# Patient Record
Sex: Female | Born: 1995 | Race: White | Hispanic: No | Marital: Single | Smoking: Current every day smoker
Health system: Southern US, Community
[De-identification: ages and names within clinical notes are randomized; demographics above are authoritative.]

## PROBLEM LIST (undated history)

## (undated) DIAGNOSIS — Z975 Presence of (intrauterine) contraceptive device: Secondary | ICD-10-CM

## (undated) DIAGNOSIS — B1920 Unspecified viral hepatitis C without hepatic coma: Secondary | ICD-10-CM

## (undated) DIAGNOSIS — F909 Attention-deficit hyperactivity disorder, unspecified type: Secondary | ICD-10-CM

## (undated) HISTORY — DX: Unspecified viral hepatitis C without hepatic coma: B19.20

## (undated) HISTORY — DX: Presence of (intrauterine) contraceptive device: Z97.5

## (undated) HISTORY — PX: NO PAST SURGERIES: SHX2092

---
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
## 2017-01-06 ENCOUNTER — Encounter: Payer: Medicaid Other | Admitting: Adult Health

## 2017-01-06 ENCOUNTER — Encounter: Payer: Self-pay | Admitting: *Deleted

## 2017-03-15 ENCOUNTER — Encounter: Payer: Self-pay | Admitting: *Deleted

## 2017-03-15 ENCOUNTER — Encounter: Payer: Medicaid Other | Admitting: Women's Health

## 2017-04-29 ENCOUNTER — Telehealth: Payer: Self-pay | Admitting: Obstetrics & Gynecology

## 2017-04-29 NOTE — Telephone Encounter (Signed)
Pt called stating that her nexplanon was due to come out in March of this year. She is asking if leaving it in can cause infertility or other adverse affects. I explained that it does not cause infertility but if its past 3 years it may not be effective in preventing pregnancy and that she should get it removed and reinserted soon. Pt states she has made an appt for that.

## 2017-08-06 ENCOUNTER — Encounter (HOSPITAL_COMMUNITY): Payer: Self-pay | Admitting: Emergency Medicine

## 2017-08-06 ENCOUNTER — Other Ambulatory Visit: Payer: Self-pay

## 2017-08-06 ENCOUNTER — Emergency Department (HOSPITAL_COMMUNITY)
Admission: EM | Admit: 2017-08-06 | Discharge: 2017-08-07 | Disposition: A | Discharge status: Home / Self Care | Payer: Self-pay | Attending: Emergency Medicine | Admitting: Emergency Medicine

## 2017-08-06 DIAGNOSIS — Z046 Encounter for general psychiatric examination, requested by authority: Secondary | ICD-10-CM | POA: Insufficient documentation

## 2017-08-06 DIAGNOSIS — F17210 Nicotine dependence, cigarettes, uncomplicated: Secondary | ICD-10-CM | POA: Insufficient documentation

## 2017-08-06 DIAGNOSIS — F1910 Other psychoactive substance abuse, uncomplicated: Secondary | ICD-10-CM | POA: Insufficient documentation

## 2017-08-06 DIAGNOSIS — N898 Other specified noninflammatory disorders of vagina: Secondary | ICD-10-CM | POA: Insufficient documentation

## 2017-08-06 DIAGNOSIS — Z79899 Other long term (current) drug therapy: Secondary | ICD-10-CM | POA: Insufficient documentation

## 2017-08-06 LAB — PREGNANCY, URINE: Preg Test, Ur: NEGATIVE

## 2017-08-06 LAB — CBC
HCT: 42.8 % (ref 36.0–46.0)
HEMOGLOBIN: 13.8 g/dL (ref 12.0–15.0)
MCH: 30.1 pg (ref 26.0–34.0)
MCHC: 32.2 g/dL (ref 30.0–36.0)
MCV: 93.2 fL (ref 78.0–100.0)
PLATELETS: 339 10*3/uL (ref 150–400)
RBC: 4.59 MIL/uL (ref 3.87–5.11)
RDW: 13.3 % (ref 11.5–15.5)
WBC: 7.4 10*3/uL (ref 4.0–10.5)

## 2017-08-06 LAB — COMPREHENSIVE METABOLIC PANEL
ALK PHOS: 76 U/L (ref 38–126)
ALT: 30 U/L (ref 14–54)
ANION GAP: 9 (ref 5–15)
AST: 22 U/L (ref 15–41)
Albumin: 4.8 g/dL (ref 3.5–5.0)
BILIRUBIN TOTAL: 0.6 mg/dL (ref 0.3–1.2)
BUN: 8 mg/dL (ref 6–20)
CALCIUM: 10.1 mg/dL (ref 8.9–10.3)
CO2: 27 mmol/L (ref 22–32)
Chloride: 103 mmol/L (ref 101–111)
Creatinine, Ser: 0.95 mg/dL (ref 0.44–1.00)
GLUCOSE: 85 mg/dL (ref 65–99)
POTASSIUM: 4 mmol/L (ref 3.5–5.1)
Sodium: 139 mmol/L (ref 135–145)
TOTAL PROTEIN: 8.6 g/dL — AB (ref 6.5–8.1)

## 2017-08-06 LAB — RAPID URINE DRUG SCREEN, HOSP PERFORMED
AMPHETAMINES: NOT DETECTED
Barbiturates: NOT DETECTED
Benzodiazepines: NOT DETECTED
COCAINE: POSITIVE — AB
OPIATES: NOT DETECTED
Tetrahydrocannabinol: POSITIVE — AB

## 2017-08-06 LAB — ETHANOL

## 2017-08-06 NOTE — ED Notes (Signed)
Pt wanded in triage  

## 2017-08-06 NOTE — ED Notes (Signed)
Patient given Sprite Zero at this time. 

## 2017-08-06 NOTE — ED Notes (Signed)
Patient went to restroom, patient in room given graham crackers and peanutbutter

## 2017-08-06 NOTE — ED Triage Notes (Signed)
Pt here under IVC from grandmother. Grandmother states pt has been shooting up cocaine which pt agrees to. Grandmother states pt stated she wanted to die and she wanted to kill her self. Pt denies this. Pt last used this morning. Pt denies si//hi/avh. Pt handcuffed bilateral wrist.

## 2017-08-06 NOTE — ED Notes (Signed)
Pt's grandmother called stating she was the one who took IVC papers out on pt and needed to be contacted regarding collateral information.  Her name is Darl PikesSusan and she can be reached at [phone].

## 2017-08-06 NOTE — BH Assessment (Addendum)
Tele Assessment Note   Patient Name: Amber Maynard MRN: [id number] Referring Physician: [facility] DO Location of [facility]: [facility] Location of Provider: Behavioral Health TTS Department  Amber Maynard is an 21 y.o. female who was IVC'd today by her Adaline SillGM Susan Costine [phone]). Per IVC, GM sts pt threatened to kill herself and stated she wanted to die. Pt denies SI, HI, SHI and AVH. Pt has no previous hx of psychiatric treatment aside from being prescribedf ADHD medication by her PCP. Pt has no psychiatrist or OP therapist and never has had. Pt has never been psychiatrically hospitalized. IVC sts that pt uses cocaine regularly and pt admits regular cocaine and infrequent cannabis use. Pt sts she uses cocaine 5 of 7 days in a week. Pt sts she wants to stop using and told ED staff she recently went to a rehab facility to ask about admission. Pt sts she left when the facility looked too much "like a mental health place." Pt denies making any statements threatening to kill herself or stating she wants to die. Pt sts that her GM IVC'd her because "she wants to speed the process up" meaning the process of pt going to rehab. Pt's GM could not be reached at the time of the assessment. A HIPAA compliant message was left for a callback.   Pt sts she lives with her GM. Pt sts she has obtained her GED and works at [employer] as a Conservation officer, naturecashier. Pt denies all symptoms of depression and anxiety. Pt sts she has never been married and has no children. Pt denies any anger outbursts resulting in harm to others or property damage. Pt denies any access to guns or weapons. Pt denies any hx of psychiatric treatment including IP or OP treatment. Pt sts she sleeps about 5-6 hours each night and eats regularly and well with no significant weight changes in recent months. Pt denies any hx of abuse. Pt sts she uses cocaine 5 of 7 days each week, smokes 1/2 pack of cigarettes daily and uses cannabis once every 2-3 months. Pt tested  positive for cannabis and cocaine tonight in the [facility] but negative for all others.   Pt was dressed in appropriate, modest street clothes and sitting on her hospital bed. Pt appeared disheveled. Pt appeared pleasant and euthymic. Pt was alert, cooperative and polite. Pt kept good eye contact, spoke in a clear tone and at a normal pace. Pt moved in a normal manner when moving. Pt's thought process was coherent and relevant and judgement was partially impaired related to drug use.  No indication of delusional thinking or response to internal stimuli. Pt's mood was stated as neither depressed nor anxious and her euthymic affect was congruent.  Pt was oriented x 4, to person, place, time and situation.   Diagnosis: Cocaine Use D/O, Severe; Cannabis Use D/O, Mild  Past Medical History:  Past Medical History:  Diagnosis Date  . ADHD (attention deficit hyperactivity disorder)   . Nexplanon in place 03/14/2014    History reviewed. No pertinent surgical history.  Family History: History reviewed. No pertinent family history.  Social History:  reports that she has been smoking cigarettes.  She has been smoking about 0.50 packs per day. she has never used smokeless tobacco. She reports that she uses drugs. Drug: Cocaine. She reports that she does not drink alcohol.  Additional Social History:  Alcohol / Drug Use Prescriptions: SEE MAR History of alcohol / drug use?: Yes Longest period of sobriety (when/how long): UNKNOWN  Substance #1 Name of Substance 1: COCAINE 1 - Age of First Use: 19 1 - Amount (size/oz): VARIES 1 - Frequency: 5 OF 7 DAYS PER WEEK 1 - Duration: ONGOING 1 - Last Use / Amount: 08/06/17 Substance #2 Name of Substance 2: NICOTINE/CIGARETTES 2 - Age of First Use: 17 2 - Amount (size/oz): 1/2 PACK 2 - Frequency: DAILY 2 - Duration: ONGOING 2 - Last Use / Amount: 08/06/17 Substance #3 Name of Substance 3: CANNABIS 3 - Age of First Use: 17 3 - Amount (size/oz): VARIES 3 -  Frequency: ONCE EVERY 2-3 MONTHS 3 - Duration: ONGOING 3 - Last Use / Amount: LAST WEEK  CIWA: CIWA-Ar BP: 107/76 Pulse Rate: 78 COWS:    PATIENT STRENGTHS: (choose at least two) Average or above average intelligence Communication skills Physical Health Supportive family/friends  Allergies: No Known Allergies  Home Medications:  (Not in a hospital admission)  OB/GYN Status:  Patient's last menstrual period was 08/03/2017.  General Assessment Data Location of Assessment: [facility] TTS Assessment: In system Is this a Tele or Face-to-Face Assessment?: Tele Assessment Is this an Initial Assessment or a Re-assessment for this encounter?: Initial Assessment Marital status: Single Maiden name: HAKEEM Is patient pregnant?: No Pregnancy Status: No Living Arrangements: Other relatives(LIVES W GM) Can pt return to current living arrangement?: Yes Admission Status: Involuntary Is patient capable of signing voluntary admission?: Yes Referral Source: Self/Family/Friend Insurance type: SELF PAY     Crisis Care Plan Living Arrangements: Other relatives(LIVES W GM) Name of Psychiatrist: NONE-PCP PRESCRIBES ADHD MEDS Name of Therapist: NONE  Education Status Is patient currently in school?: No Highest grade of school patient has completed: GED  Risk to self with the past 6 months Suicidal Ideation: No(DENIES) Has patient been a risk to self within the past 6 months prior to admission? : No Suicidal Intent: No(DENIES) Has patient had any suicidal intent within the past 6 months prior to admission? : No Is patient at risk for suicide?: No Suicidal Plan?: No Has patient had any suicidal plan within the past 6 months prior to admission? : No Access to Means: No(DENIES ACCESS TO GUNS) What has been your use of drugs/alcohol within the last 12 months?: REGULAR USE Previous Attempts/Gestures: No(DENIES) How many times?: 0 Other Self Harm Risks: NONE REPORTED Triggers for Past  Attempts: None known Intentional Self Injurious Behavior: None Family Suicide History: Unknown Recent stressful life event(s): (NONE REPORTED) Persecutory voices/beliefs?: No Depression: No Depression Symptoms: (DENIES ALL SYMPTOMS) Substance abuse history and/or treatment for substance abuse?: Yes(USE-YES; TX-NO) Suicide prevention information given to non-admitted patients: Not applicable  Risk to Others within the past 6 months Homicidal Ideation: No(DENIES) Does patient have any lifetime risk of violence toward others beyond the six months prior to admission? : No(DENIES) Thoughts of Harm to Others: No(DENIES) Current Homicidal Intent: No Current Homicidal Plan: No Access to Homicidal Means: No Identified Victim: NONE History of harm to others?: No(DENIES) Assessment of Violence: None Noted Violent Behavior Description: NA Does patient have access to weapons?: No Criminal Charges Pending?: No(DENIES) Does patient have a court date: No Is patient on probation?: No  Psychosis Hallucinations: None noted(DENIES) Delusions: None noted  Mental Status Report Appearance/Hygiene: Disheveled, Unremarkable Eye Contact: Good Motor Activity: Freedom of movement Speech: Logical/coherent Level of Consciousness: Alert Mood: Pleasant, Euthymic Affect: Appropriate to circumstance Anxiety Level: Minimal Thought Processes: Coherent, Relevant Judgement: Partial Orientation: Person, Place, Time, Situation Obsessive Compulsive Thoughts/Behaviors: None  Cognitive Functioning Concentration: Normal Memory: Recent Intact, Remote  Intact IQ: Average Insight: Good Impulse Control: Fair Appetite: Good Weight Loss: 0 Weight Gain: 0 Sleep: No Change Total Hours of Sleep: (5-6) Vegetative Symptoms: None  ADLScreening [facility] Assessment Services) Patient's cognitive ability adequate to safely complete daily activities?: Yes Patient able to express need for assistance with ADLs?:  Yes Independently performs ADLs?: Yes (appropriate for developmental age)  Prior Inpatient Therapy Prior Inpatient Therapy: No  Prior Outpatient Therapy Prior Outpatient Therapy: No Does patient have an ACCT team?: No Does patient have Intensive In-House Services?  : No Does patient have [facility] services? : No Does patient have P4CC services?: No  ADL Screening (condition at time of admission) Patient's cognitive ability adequate to safely complete daily activities?: Yes Patient able to express need for assistance with ADLs?: Yes Independently performs ADLs?: Yes (appropriate for developmental age)       Abuse/Neglect Assessment (Assessment to be complete while patient is alone) Physical Abuse: Denies Verbal Abuse: Denies Sexual Abuse: Denies     Merchant navy officerAdvance Directives (For Healthcare) Does Patient Have a Medical Advance Directive?: No Would patient like information on creating a medical advance directive?: No - Patient declined    Additional Information 1:1 In Past 12 Months?: No CIRT Risk: No Elopement Risk: No Does patient have medical clearance?: Yes     Disposition:  Disposition Initial Assessment Completed for this Encounter: Yes Disposition of Patient: Other dispositions Other disposition(s): Other (Comment)([facility]ING [facility])  This service was provided via telemedicine using a 2-way, interactive audio and Immunologistvideo technology.  Names of all persons participating in this telemedicine service and their role in this encounter. Name: Teola BradleyBrooklyn Maynard Role: Patient  Name: Beryle FlockMary Malania Gawthrop Role: [facility] Specialist, [facility], CRC  Name:  Role:   Name:  Role:    Seeking collateral information from GM before final recommendation.    Beryle FlockMary Kristi Norment, MS, CRC, [facility] Triage Specialist [facility] Amber Maynard T 08/06/2017 8:53 PM

## 2017-08-06 NOTE — ED Provider Notes (Signed)
[facility] EMERGENCY DEPARTMENT Provider Note   CSN: [id number] Arrival date & time: 08/06/17  1248     History   Chief Complaint Chief Complaint  Patient presents with  . Medical Clearance    HPI Amber Maynard is a 21 y.o. female.  HPI  Pt was seen at 1815. Per Police and pt:  Pt brought to the ED with Police under IVC taken out by her grandmother. Pt states she "uses drugs" and her grandmother became aware. Pt states she wanted to go to rehab, but when she got to the facility she "left because it was like a mental health place." IVC states pt "wanted to die" and "wanted to kill herself." Pt denies this. LD cocaine this morning. Denies SI, no SA, no HI, no hallucinations.    Past Medical History:  Diagnosis Date  . ADHD (attention deficit hyperactivity disorder)   . Nexplanon in place 03/14/2014    Patient Active Problem List   Diagnosis Date Noted  . Right ovarian cyst 06/03/2016  . Nexplanon in place 03/14/2014    History reviewed. No pertinent surgical history.  OB History    Gravida Para Term Preterm AB Living   0 0 0 0 0 0   SAB TAB Ectopic Multiple Live Births   0 0 0 0 0       Home Medications    Prior to Admission medications   Medication Sig Start Date End Date Taking? Authorizing Provider  acetaminophen (TYLENOL) 500 MG tablet Take 500 mg by mouth every 6 (six) hours as needed.   Yes [provider]  amphetamine-dextroamphetamine (ADDERALL) 20 MG tablet Take 20 mg by mouth daily.   Yes [provider]  etonogestrel (NEXPLANON) 68 MG IMPL implant Inject 1 each into the skin once.   Yes [provider]  naproxen (NAPROSYN) 500 MG tablet Take 1 tablet (500 mg total) by mouth 2 (two) times daily with a meal. 06/03/16  Yes Cresenzo-Dishmon, Scarlette Calico, CNM    Family History History reviewed. No pertinent family history.  Social History Social History   Tobacco Use  . Smoking status: Current Every Day Smoker   Packs/day: 0.50    Types: Cigarettes  . Smokeless tobacco: Never Used  Substance Use Topics  . Alcohol use: No  . Drug use: Yes    Types: Cocaine     Allergies   Patient has no known allergies.   Review of Systems Review of Systems ROS: Statement: All systems negative except as marked or noted in the HPI; Constitutional: Negative for fever and chills. ; ; Eyes: Negative for eye pain, redness and discharge. ; ; ENMT: Negative for ear pain, hoarseness, nasal congestion, sinus pressure and sore throat. ; ; Cardiovascular: Negative for chest pain, palpitations, diaphoresis, dyspnea and peripheral edema. ; ; Respiratory: Negative for cough, wheezing and stridor. ; ; Gastrointestinal: Negative for nausea, vomiting, diarrhea, abdominal pain, blood in stool, hematemesis, jaundice and rectal bleeding. . ; ; Genitourinary: Negative for dysuria, flank pain and hematuria. ; ; Musculoskeletal: Negative for back pain and neck pain. Negative for swelling and trauma.; ; Skin: Negative for pruritus, rash, abrasions, blisters, bruising and skin lesion.; ; Neuro: Negative for headache, lightheadedness and neck stiffness. Negative for weakness, altered level of consciousness, altered mental status, extremity weakness, paresthesias, involuntary movement, seizure and syncope.;; Psych:  Denies SI while in the ED. No SA, no HI, no hallucinations.     Physical Exam Updated Vital Signs BP 107/76 (BP Location: Left  Arm)   Pulse 78   Temp 98.3 F (36.8 C) (Oral)   Resp 17   LMP 08/03/2017   SpO2 100%   Physical Exam 1820: Physical examination:  Nursing notes reviewed; Vital signs and O2 SAT reviewed;  Constitutional: Well developed, Well nourished, Well hydrated, In no acute distress; Head:  Normocephalic, atraumatic; Eyes: EOMI, PERRL, No scleral icterus; ENMT: Mouth and pharynx normal, Mucous membranes moist; Neck: Supple, Full range of motion; Cardiovascular: Regular rate and rhythm; Respiratory: Breath sounds  clear, No wheezes.  Speaking full sentences with ease, Normal respiratory effort/excursion; Chest: No deformity, Movement normal; Abdomen: Nondistended; Extremities: No deformity.; Neuro: AA&Ox3, Major CN grossly intact.  Speech clear. No gross focal motor deficits in extremities. Climbs on and off stretcher easily by herself. Gait steady.; Skin: Color normal, Warm, Dry.; Psych:  Affect full. Denies SI/SA.     ED Treatments / Results  Labs (all labs ordered are listed, but only abnormal results are displayed)   EKG  EKG Interpretation None       Radiology   Procedures Procedures (including critical care time)  Medications Ordered in ED Medications - No data to display   Initial Impression / Assessment and Plan / ED Course  I have reviewed the triage vital signs and the nursing notes.  Pertinent labs & imaging results that were available during my care of the patient were reviewed by me and considered in my medical decision making (see chart for details).  MDM Reviewed: previous chart, nursing note and vitals Reviewed previous: labs Interpretation: labs   Results for orders placed or performed during the hospital encounter of 08/06/17  Comprehensive metabolic panel  Result Value Ref Range   Sodium 139 135 - 145 mmol/L   Potassium 4.0 3.5 - 5.1 mmol/L   Chloride 103 101 - 111 mmol/L   CO2 27 22 - 32 mmol/L   Glucose, Bld 85 65 - 99 mg/dL   BUN 8 6 - 20 mg/dL   Creatinine, Ser 1.610.95 0.44 - 1.00 mg/dL   Calcium 09.610.1 8.9 - 04.510.3 mg/dL   Total Protein 8.6 (H) 6.5 - 8.1 g/dL   Albumin 4.8 3.5 - 5.0 g/dL   AST 22 15 - 41 U/L   ALT 30 14 - 54 U/L   Alkaline Phosphatase 76 38 - 126 U/L   Total Bilirubin 0.6 0.3 - 1.2 mg/dL   GFR calc non Af Amer >60 >60 mL/min   GFR calc Af Amer >60 >60 mL/min   Anion gap 9 5 - 15  Ethanol  Result Value Ref Range   Alcohol, Ethyl (B) <10 <10 mg/dL  cbc  Result Value Ref Range   WBC 7.4 4.0 - 10.5 K/uL   RBC 4.59 3.87 - 5.11 MIL/uL    Hemoglobin 13.8 12.0 - 15.0 g/dL   HCT 40.942.8 81.136.0 - 91.446.0 %   MCV 93.2 78.0 - 100.0 fL   MCH 30.1 26.0 - 34.0 pg   MCHC 32.2 30.0 - 36.0 g/dL   RDW 78.213.3 95.611.5 - 21.315.5 %   Platelets 339 150 - 400 K/uL  Rapid urine drug screen (hospital performed)  Result Value Ref Range   Opiates NONE DETECTED NONE DETECTED   Cocaine POSITIVE (A) NONE DETECTED   Benzodiazepines NONE DETECTED NONE DETECTED   Amphetamines NONE DETECTED NONE DETECTED   Tetrahydrocannabinol POSITIVE (A) NONE DETECTED   Barbiturates NONE DETECTED NONE DETECTED  Pregnancy, urine  Result Value Ref Range   Preg Test, Ur NEGATIVE NEGATIVE  1840:  Will have TTS evaluate. Holding orders written.    Final Clinical Impressions(s) / ED Diagnoses   Final diagnoses:  None    ED Discharge Orders    None       Samuel JesterMcManus, Antavia Tandy, DO 08/06/17 2133

## 2017-08-06 NOTE — B[facility] Counselor (Signed)
Called GM Monia PouchSusan Costine at [phone] with pt's permission. GM was petitioner on IVC today. No answer. Left a HIPAA compliant message for a callback.

## 2017-08-07 LAB — WET PREP, GENITAL
Clue Cells Wet Prep HPF POC: NONE SEEN
SPERM: NONE SEEN
TRICH WET PREP: NONE SEEN
Yeast Wet Prep HPF POC: NONE SEEN

## 2017-08-07 NOTE — ED Notes (Signed)
Unable to reach next of kin by phone.

## 2017-08-07 NOTE — Discharge Instructions (Signed)
Substance Abuse Treatment Programs ° °Intensive Outpatient Programs °[facility]     °[address]      °[place], [place]                   °[phone]      ° °[facility] °[address] °[place], [place] °336-379-7146 ° °[facility]     °(Inpatient and outpatient)     °[address]           °336-832-9800   ° °[facility] °336-288-1484 (Suboxone and Methadone) ° °[address]      °[place], [place] [address]      °336-882-2125      ° °[address] °[place], [place] °852-3033 ° °[facility] (Outpatient/Inpatient, Chemical)    °(insurance only) 336-621-3381      °       °[facility] (Groups & Residential) °[place], [place] °336-389-1413 ° °   °[facility]     °[address]     °[place], [place]      °336-389-1413      ° °[facility] (for caregivers and family) °[address] °[place], [place] °336-299-4655 ° ° ° ° ° °Residential Treatment Programs °[facility]      °[address], [place], [place] [address]  °(336) [phone]      ° °[facility] °[address] [place], [place] [address] °919-419-1059 ° °[facility]        °336-248-8914      ° °[facility] °1-800-659-3381 ° °[facility]             °[address]                                         °[place], [place]                                                °877-615-2722 or 336-784-9470                              ° °[facility] [place] °[address] °[place], [address] °1.877.941.8954 ° °[facility]    °[address]      °, [place]     °336-273-5306      ° °[facility] °[address] °, [place] °336-285-9073 ° °[facility]   °[address]     °[place], [place] [address]     °336-899-1550      °Admissions: 8am-3pm M-F ° °[facility] °[address] °[place] °336-227-7417 ° °[facility]: Residential Program (90 Days)   °[place], [place]      °336-725-8389 or 800-758-6077    ° °[facility] °[place], [place] °(Walk in Hours over the weekend or by referral) ° °[facility] °[address], [place], [place] [address] °(336) 723-1848 ° °Crisis Mobile: [facility]:  1-877-626-1772 (for crisis response 24 hours a day) °[facility] Hotline:      1-800-256-2452 °Outpatient Psychiatry and Counseling ° °[facility]: Mobile Crisis   Management 24 hours:  1-877-626-1772 ° °[facility] sliding scale fee and walk in schedule: M-F 8am-12pm/1pm-3pm °[address]  °[place], [place] [address] °336-387-6161 ° °[facility] °[address] °[place], [place] [address] °336-703-9650 ° °[facility] ([facility])- new patient walk-in appointments available Monday - Friday 8am -3pm.          °[address] °[place], [place] [address] °336-676-6840 or crisis line- 336-676-6905 ° °[facility] Intensive Outpatient Therapy Program °[address] °[place], [place] [address] °336-832-9804 ° °[facility]                  °Crisis Services      °336.641.4993      °[address]     °[place], [place] [address]                ° °[facility]   °[facility] °800.525.9375 °[address] °[place], [place] [address] ° ° °[facility]          °[address]  °[place], [place] [address]       °(336) [phone] ° °[facility] °[address] °[place], [place] [address] °336-292-1510 ° °[facility]    °[address]    °[place], [place] [address]     °336-632-3505      ° °Amber McKinney, Amber Maynard     °[address]     °[place] [address]     °336-282-1251     °  °[facility] °[address] °[place] [address] ° °[facility]     °[address]     °[place], [place]      °336-542-2076      ° °[facility] °Amber Ahluwalia, Amber Maynard °[address] °[place], [place] [address] °336-420-9558 ° °[facility]     °[address]     °[place], [place] [address]     °336-274-1237      ° °[facility] °[address] °[place], [place] [address] °336-854-4450 °Resources for Temporary Residential Assistance/Crisis Centers ° °DAY CENTERS °[facility] °M-F 8am-3pm   °[address] [place], [place] [address]   336-332-0824 °Services include: laundry, barbering, support groups, case management, phone  & computer access, showers, AA/NA mtgs, mental health/substance abuse nurse, job skills class, disability information, VA assistance, spiritual classes, etc.  ° °HOMELESS SHELTERS ° °[facility]     °[facility]   °[address], [place]     °336.271.5959       °       °[facility] (women and children)       °[address] °[place], [place] [address] °336-275-0820 °[email or web address] for application and process °Application Required ° °[facility]   °[address]    °[place] [address]     °336.886.4922       °             °[facility] °[address] °[place], [place] [address] °336.273.5572 °336-235-0363(schedule application appt.) °Application Required ° °[facility] (women only)    °[address]     °[place], [place] [address]     °336-884-1039      °  Intake starts 6pm daily Need valid ID, SSC, & Police report [facility] [place] [address] [place] [phone] Application Required  [facility] (men only)     [address]      [place]     [phone]       [facility] (Pregnant women only) [address] [place], [place] [phone]  [facility]      [address]      [place] [address]     [phone]             [facility] [address] [place] [phone] 90 day commitment/SA/Application process  [facility](men only)     [address]     [place], [place]     [phone]       Check-in at [facility]7pm            [facility] [address] [place], [place] [phone] Men/Women/Women and Children must be there by 7 pm  [facility] [place] [phone]                  Call your regular medical doctor and the substance abuse resources given to you today to schedule a follow up appointment within the next week.  Return to the Emergency Department immediately sooner if worsening.

## 2017-08-07 NOTE — BHH Counselor (Addendum)
08/07/2017 at 1004:  Attempted to contact Patient's Grand Mother, Amber Maynard at [phone], for collateral information as petitioner of IVC.  HIPPA compliant voicemail message left requesting return call as soon as possible.      08/07/2017 at 1222:  Attempted to contact Ms. Maynard, call went straight to voicemail.    08/07/2017 at 1511:  Attempted to contact Ms. Maynard, call went straight to voicemail.     08/07/2017 at 1739:  Attempted to contact Ms. Maynard, call went straight to voicemail.

## 2017-08-07 NOTE — Progress Notes (Addendum)
Patient continues to meet inpatient criteria and continue seeking collateral information from IVC petitioner (pt's grandmother). CSW faxed referrals to the following facilities for review: [facility], [facility], [facility], [facility], [facility], [facility], [facility].   TTS will continue to seek bed placement.   Trula SladeHeather Smart, MSW, LCSW Clinical Social Worker 08/07/2017 3:04 PM   [facility] called to request IVC paperwork for patient and are reviewing her for possible admission. CSW spoke with Caralyn GuileSandra RN and requested that she fax [facility]C paperwork directly to [facility]: [phone]. Pt still in review.  Trula SladeHeather Smart, MSW, LCSW Clinical Social Worker 08/07/2017 3:31 PM

## 2017-08-07 NOTE — ED Provider Notes (Signed)
I called the phone number on chart for pt's grandmother Amber Maynard and was able to speak with her. She is now aware that TTS has been trying to speak with her regarding her granddaughter. She will stay by the phone and await TTS call.  I called Pt Assessment and made them aware; they will call pt's grandmother. Pt also updated.    Amber Maynard, Amber Vanbenschoten, DO 08/07/17 1911

## 2017-08-07 NOTE — ED Notes (Signed)
[facility] contacted about IVC paperwork. NP is being informed to contact Dr. Clarene DukeMcManus at [phone].

## 2017-08-07 NOTE — ED Provider Notes (Signed)
Spoke with TTS NP:  They have not been able to reach pt's grandmother for collateral information, pt also denies SI/HI/hallucinations to them as well as continues to deny this to ED staff, agrees there is no clear indication for IVC at this time. ED RN has attempted to call pt's grandmother without success. Pt remains calm/cooperative.    Samuel JesterMcManus, Laelia Angelo, DO 08/07/17 [phone]

## 2017-08-07 NOTE — ED Provider Notes (Signed)
Pt requested to see me: pt requesting evaluation for "yellow" vaginal discharge. Pelvic exam performed with permission of pt and female ED RN assist during exam.  External genitalia w/o lesions. Vaginal vault with thick yellow discharge.  Cervix w/o lesions, not friable, GC/chlam and wet prep obtained and sent to lab.  Bimanual exam w/o CMT, uterine or adnexal tenderness.  Wet prep negative.  T/C from TTS: they have spoken with pt's grandmother, states pt has not had/voiced SI and GM thought that she could get pt into a rehab facility "faster." GM made aware that IVC will be rescinded and pt d/c; verb understanding.   Dx and testing, as well as d/w TTS, d/w pt.  Questions answered.  Verb understanding, agreeable to d/c home with outpt f/u.    Results for orders placed or performed during the hospital encounter of 08/06/17  Wet prep, genital  Result Value Ref Range   Yeast Wet Prep HPF POC NONE SEEN NONE SEEN   Trich, Wet Prep NONE SEEN NONE SEEN   Clue Cells Wet Prep HPF POC NONE SEEN NONE SEEN   WBC, Wet Prep HPF POC FEW (A) NONE SEEN   Sperm NONE SEEN         Samuel JesterMcManus, Clella Mckeel, DO 08/07/17 2038

## 2017-08-07 NOTE — ED Notes (Signed)
[facility]work [facility] without Examination and Recommendation filled out per MD pt denies SI/HI and will not IVC patient without reasoning to why it needs filled out. Dr. Clarene DukeMcManus talked with [facility] NP. [facility] states unable to get a hold of the pt's Grandmother. RN made aware.
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
## 2017-08-07 NOTE — BHH Counselor (Signed)
Reassessment:  Patient reassessed on this date, 08-07-2017.  Patient denied current SI, HI, and AVH.  She reports sleeping 5 hours last night and feeling well rested this morning.  Patient reports having an appetite and eating breakfast this morning.  Patient reports wanting outpatient substance use treatment for Cannabis and cocaine usage.  Patient gave continued permission for [facility], Monia PouchSusan Costine [phone], to be contacted for collateral information in reference to being petitioner of IVC.  She confirmed Grand Mother's telephone number mand reports  feeling Grand Mother is upset with her as the reason she has not returned previous telephone calls.  Patient continues to meet inpatient criteria and continue seeking collateral  Information from [facility].

## 2017-08-09 LAB — GC/CHLAMYDIA PROBE AMP (~~LOC~~) NOT AT ARMC
CHLAMYDIA, DNA PROBE: POSITIVE — AB
Neisseria Gonorrhea: NEGATIVE

## 2017-10-14 ENCOUNTER — Ambulatory Visit: Payer: Self-pay | Admitting: Obstetrics and Gynecology

## 2017-11-08 ENCOUNTER — Other Ambulatory Visit: Payer: Self-pay | Admitting: Adult Health

## 2017-11-08 ENCOUNTER — Encounter: Payer: Self-pay | Admitting: *Deleted

## 2017-12-14 ENCOUNTER — Telehealth: Payer: Self-pay | Admitting: Obstetrics & Gynecology

## 2017-12-14 NOTE — Telephone Encounter (Signed)
Informed patient that we do not have a nexplanon with her name on it here.  Advised she would need to make appointment with Victorino DikeJennifer to discuss and order device.  Call transferred to Angie.

## 2017-12-23 ENCOUNTER — Encounter: Payer: Medicaid Other | Admitting: Adult Health

## 2018-01-04 ENCOUNTER — Encounter: Payer: Self-pay | Admitting: *Deleted

## 2018-01-04 ENCOUNTER — Other Ambulatory Visit: Payer: Medicaid Other | Admitting: Adult Health

## 2018-06-10 ENCOUNTER — Encounter (HOSPITAL_COMMUNITY): Payer: Self-pay | Admitting: Emergency Medicine

## 2018-06-10 ENCOUNTER — Other Ambulatory Visit: Payer: Self-pay

## 2018-06-10 ENCOUNTER — Emergency Department (HOSPITAL_COMMUNITY)
Admission: EM | Admit: 2018-06-10 | Discharge: 2018-06-10 | Disposition: A | Discharge status: Home / Self Care | Payer: Medicaid Other | Attending: Emergency Medicine | Admitting: Emergency Medicine

## 2018-06-10 DIAGNOSIS — Z79899 Other long term (current) drug therapy: Secondary | ICD-10-CM | POA: Insufficient documentation

## 2018-06-10 DIAGNOSIS — N760 Acute vaginitis: Secondary | ICD-10-CM | POA: Insufficient documentation

## 2018-06-10 DIAGNOSIS — N898 Other specified noninflammatory disorders of vagina: Secondary | ICD-10-CM

## 2018-06-10 DIAGNOSIS — J029 Acute pharyngitis, unspecified: Secondary | ICD-10-CM | POA: Insufficient documentation

## 2018-06-10 DIAGNOSIS — F17210 Nicotine dependence, cigarettes, uncomplicated: Secondary | ICD-10-CM | POA: Insufficient documentation

## 2018-06-10 DIAGNOSIS — B9689 Other specified bacterial agents as the cause of diseases classified elsewhere: Secondary | ICD-10-CM

## 2018-06-10 LAB — WET PREP, GENITAL
Sperm: NONE SEEN
Trich, Wet Prep: NONE SEEN
Yeast Wet Prep HPF POC: NONE SEEN

## 2018-06-10 LAB — RAPID HIV SCREEN (HIV 1/2 AB+AG)
HIV 1/2 ANTIBODIES: NONREACTIVE
HIV-1 P24 Antigen - HIV24: NONREACTIVE

## 2018-06-10 LAB — GROUP A STREP BY PCR: Group A Strep by PCR: NOT DETECTED

## 2018-06-10 LAB — POC URINE PREG, ED: Preg Test, Ur: NEGATIVE

## 2018-06-10 MED ORDER — MAGIC MOUTHWASH
5.0000 mL | Freq: Once | ORAL | Status: AC
  Administered 2018-06-10: 5 mL via ORAL
  Filled 2018-06-10: qty 5

## 2018-06-10 MED ORDER — IBUPROFEN 600 MG PO TABS: 600.0000 mg | ORAL_TABLET | Freq: Four times a day (QID) | ORAL | 0 refills | Status: DC | PRN

## 2018-06-10 MED ORDER — AZITHROMYCIN 250 MG PO TABS
1000.0000 mg | ORAL_TABLET | Freq: Once | ORAL | Status: AC
  Administered 2018-06-10: 1000 mg via ORAL
  Filled 2018-06-10: qty 4

## 2018-06-10 MED ORDER — DOXYCYCLINE HYCLATE 100 MG PO CAPS: 100.0000 mg | ORAL_CAPSULE | Freq: Two times a day (BID) | ORAL | 0 refills | Status: DC

## 2018-06-10 MED ORDER — CEFTRIAXONE SODIUM 250 MG IJ SOLR
250.0000 mg | Freq: Once | INTRAMUSCULAR | Status: AC
  Administered 2018-06-10: 250 mg via INTRAMUSCULAR
  Filled 2018-06-10: qty 250

## 2018-06-10 MED ORDER — MAGIC MOUTHWASH W/LIDOCAINE
5.0000 mL | Freq: Once | ORAL | Status: DC
  Filled 2018-06-10: qty 5

## 2018-06-10 MED ORDER — METRONIDAZOLE 500 MG PO TABS: 500.0000 mg | ORAL_TABLET | Freq: Two times a day (BID) | ORAL | 0 refills | Status: DC

## 2018-06-10 MED ORDER — LIDOCAINE VISCOUS HCL 2 % MT SOLN
5.0000 mL | Freq: Once | OROMUCOSAL | Status: AC
  Administered 2018-06-10: 5 mL via OROMUCOSAL
  Filled 2018-06-10: qty 15

## 2018-06-10 NOTE — ED Triage Notes (Addendum)
Sore throat since yesterday.  Also reports she would like to be checked for chlamydia

## 2018-06-10 NOTE — Discharge Instructions (Addendum)
Please gargle salt water 3-5 times daily.  Take ibuprofen as needed for pain.  Take doxycycline and flagyl as treatment for infection.  You will be notify in the next few days if you tested positive for STI.

## 2018-06-10 NOTE — ED Notes (Signed)
Pt was informed that we need a urine sample. Pt states that she can not urinate at this time. 
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
## 2018-06-11 LAB — RPR: RPR Ser Ql: NONREACTIVE

## 2018-06-13 LAB — GC/CHLAMYDIA PROBE AMP (~~LOC~~) NOT AT ARMC
Chlamydia: POSITIVE — AB
NEISSERIA GONORRHEA: NEGATIVE

## 2019-11-26 ENCOUNTER — Encounter (HOSPITAL_COMMUNITY): Payer: Self-pay | Admitting: Emergency Medicine

## 2019-11-26 ENCOUNTER — Other Ambulatory Visit: Payer: Self-pay

## 2019-11-26 ENCOUNTER — Emergency Department (HOSPITAL_COMMUNITY): Payer: Self-pay

## 2019-11-26 ENCOUNTER — Inpatient Hospital Stay (HOSPITAL_COMMUNITY)
Admission: EM | Admit: 2019-11-26 | Discharge: 2019-11-28 | DRG: 917 | Disposition: A | Discharge status: Home / Self Care | Payer: Self-pay | Attending: Internal Medicine | Admitting: Internal Medicine

## 2019-11-26 DIAGNOSIS — R7401 Elevation of levels of liver transaminase levels: Secondary | ICD-10-CM | POA: Diagnosis present

## 2019-11-26 DIAGNOSIS — G92 Toxic encephalopathy: Secondary | ICD-10-CM | POA: Diagnosis present

## 2019-11-26 DIAGNOSIS — Z79891 Long term (current) use of opiate analgesic: Secondary | ICD-10-CM

## 2019-11-26 DIAGNOSIS — F17200 Nicotine dependence, unspecified, uncomplicated: Secondary | ICD-10-CM

## 2019-11-26 DIAGNOSIS — F329 Major depressive disorder, single episode, unspecified: Secondary | ICD-10-CM | POA: Diagnosis present

## 2019-11-26 DIAGNOSIS — J9690 Respiratory failure, unspecified, unspecified whether with hypoxia or hypercapnia: Secondary | ICD-10-CM | POA: Diagnosis present

## 2019-11-26 DIAGNOSIS — F909 Attention-deficit hyperactivity disorder, unspecified type: Secondary | ICD-10-CM | POA: Diagnosis present

## 2019-11-26 DIAGNOSIS — Z20822 Contact with and (suspected) exposure to covid-19: Secondary | ICD-10-CM | POA: Diagnosis present

## 2019-11-26 DIAGNOSIS — Z720 Tobacco use: Secondary | ICD-10-CM

## 2019-11-26 DIAGNOSIS — F17210 Nicotine dependence, cigarettes, uncomplicated: Secondary | ICD-10-CM | POA: Diagnosis present

## 2019-11-26 DIAGNOSIS — T50901A Poisoning by unspecified drugs, medicaments and biological substances, accidental (unintentional), initial encounter: Principal | ICD-10-CM

## 2019-11-26 DIAGNOSIS — J690 Pneumonitis due to inhalation of food and vomit: Secondary | ICD-10-CM

## 2019-11-26 DIAGNOSIS — R1011 Right upper quadrant pain: Secondary | ICD-10-CM

## 2019-11-26 DIAGNOSIS — G9340 Encephalopathy, unspecified: Secondary | ICD-10-CM

## 2019-11-26 DIAGNOSIS — J9601 Acute respiratory failure with hypoxia: Secondary | ICD-10-CM | POA: Diagnosis present

## 2019-11-26 DIAGNOSIS — Z79899 Other long term (current) drug therapy: Secondary | ICD-10-CM

## 2019-11-26 DIAGNOSIS — Z978 Presence of other specified devices: Secondary | ICD-10-CM

## 2019-11-26 DIAGNOSIS — F1910 Other psychoactive substance abuse, uncomplicated: Secondary | ICD-10-CM | POA: Diagnosis present

## 2019-11-26 LAB — CBC WITH DIFFERENTIAL/PLATELET
Abs Immature Granulocytes: 0.13 10*3/uL — ABNORMAL HIGH (ref 0.00–0.07)
Basophils Absolute: 0 10*3/uL (ref 0.0–0.1)
Basophils Relative: 0 %
Eosinophils Absolute: 0.1 10*3/uL (ref 0.0–0.5)
Eosinophils Relative: 1 %
HCT: 41.4 % (ref 36.0–46.0)
Hemoglobin: 13.8 g/dL (ref 12.0–15.0)
Immature Granulocytes: 1 %
Lymphocytes Relative: 10 %
Lymphs Abs: 1.3 10*3/uL (ref 0.7–4.0)
MCH: 31.3 pg (ref 26.0–34.0)
MCHC: 33.3 g/dL (ref 30.0–36.0)
MCV: 93.9 fL (ref 80.0–100.0)
Monocytes Absolute: 0.9 10*3/uL (ref 0.1–1.0)
Monocytes Relative: 7 %
Neutro Abs: 10.9 10*3/uL — ABNORMAL HIGH (ref 1.7–7.7)
Neutrophils Relative %: 81 %
Platelets: 317 10*3/uL (ref 150–400)
RBC: 4.41 MIL/uL (ref 3.87–5.11)
RDW: 11.8 % (ref 11.5–15.5)
WBC: 13.3 10*3/uL — ABNORMAL HIGH (ref 4.0–10.5)
nRBC: 0 % (ref 0.0–0.2)

## 2019-11-26 LAB — RAPID URINE DRUG SCREEN, HOSP PERFORMED
Amphetamines: NOT DETECTED
Barbiturates: NOT DETECTED
Benzodiazepines: NOT DETECTED
Cocaine: NOT DETECTED
Opiates: NOT DETECTED
Tetrahydrocannabinol: NOT DETECTED

## 2019-11-26 LAB — BLOOD GAS, ARTERIAL
Acid-base deficit: 1.6 mmol/L (ref 0.0–2.0)
Bicarbonate: 23.2 mmol/L (ref 20.0–28.0)
FIO2: 100
O2 Saturation: 99.4 %
Patient temperature: 37
pCO2 arterial: 38.1 mmHg (ref 32.0–48.0)
pH, Arterial: 7.391 (ref 7.350–7.450)
pO2, Arterial: 305 mmHg — ABNORMAL HIGH (ref 83.0–108.0)

## 2019-11-26 LAB — URINALYSIS, ROUTINE W REFLEX MICROSCOPIC
Bilirubin Urine: NEGATIVE
Glucose, UA: NEGATIVE mg/dL
Hgb urine dipstick: NEGATIVE
Ketones, ur: 5 mg/dL — AB
Leukocytes,Ua: NEGATIVE
Nitrite: NEGATIVE
Protein, ur: 100 mg/dL — AB
Specific Gravity, Urine: 1.019 (ref 1.005–1.030)
pH: 5 (ref 5.0–8.0)

## 2019-11-26 LAB — RESPIRATORY PANEL BY RT PCR (FLU A&B, COVID)
Influenza A by PCR: NEGATIVE
Influenza B by PCR: NEGATIVE
SARS Coronavirus 2 by RT PCR: NEGATIVE

## 2019-11-26 LAB — COMPREHENSIVE METABOLIC PANEL
ALT: 206 U/L — ABNORMAL HIGH (ref 0–44)
AST: 114 U/L — ABNORMAL HIGH (ref 15–41)
Albumin: 4.5 g/dL (ref 3.5–5.0)
Alkaline Phosphatase: 68 U/L (ref 38–126)
Anion gap: 8 (ref 5–15)
BUN: 13 mg/dL (ref 6–20)
CO2: 25 mmol/L (ref 22–32)
Calcium: 8.8 mg/dL — ABNORMAL LOW (ref 8.9–10.3)
Chloride: 104 mmol/L (ref 98–111)
Creatinine, Ser: 1.07 mg/dL — ABNORMAL HIGH (ref 0.44–1.00)
GFR calc Af Amer: >60 mL/min (ref >60)
GFR calc non Af Amer: >60 mL/min (ref >60)
Glucose, Bld: 149 mg/dL — ABNORMAL HIGH (ref 70–99)
Potassium: 3.8 mmol/L (ref 3.5–5.1)
Sodium: 137 mmol/L (ref 135–145)
Total Bilirubin: 1.3 mg/dL — ABNORMAL HIGH (ref 0.3–1.2)
Total Protein: 7.4 g/dL (ref 6.5–8.1)

## 2019-11-26 LAB — ACETAMINOPHEN LEVEL: Acetaminophen (Tylenol), Serum: <10 ug/mL — ABNORMAL LOW (ref 10–30)

## 2019-11-26 LAB — ETHANOL: Alcohol, Ethyl (B): <10 mg/dL (ref <10)

## 2019-11-26 LAB — POC URINE PREG, ED: Preg Test, Ur: NEGATIVE

## 2019-11-26 LAB — SALICYLATE LEVEL: Salicylate Lvl: <7 mg/dL — ABNORMAL LOW (ref 7.0–30.0)

## 2019-11-26 LAB — LACTIC ACID, PLASMA: Lactic Acid, Venous: 2.2 mmol/L (ref 0.5–1.9)

## 2019-11-26 LAB — CBG MONITORING, ED: Glucose-Capillary: 157 mg/dL — ABNORMAL HIGH (ref 70–99)

## 2019-11-26 LAB — TRIGLYCERIDES: Triglycerides: 69 mg/dL (ref <150)

## 2019-11-26 MED ORDER — SODIUM CHLORIDE 0.9 % IV BOLUS
1000.0000 mL | Freq: Once | INTRAVENOUS | Status: AC
  Administered 2019-11-26: 1000 mL via INTRAVENOUS

## 2019-11-26 MED ORDER — FENTANYL CITRATE (PF) 100 MCG/2ML IJ SOLN: 100.0000 ug | INTRAMUSCULAR | Status: DC | PRN

## 2019-11-26 MED ORDER — ONDANSETRON HCL 4 MG/2ML IJ SOLN
4.0000 mg | Freq: Once | INTRAMUSCULAR | Status: AC
  Administered 2019-11-26: 4 mg via INTRAVENOUS

## 2019-11-26 MED ORDER — IPRATROPIUM BROMIDE 0.02 % IN SOLN
RESPIRATORY_TRACT | Status: AC
  Administered 2019-11-26: 0.5 mg
  Filled 2019-11-26: qty 2.5

## 2019-11-26 MED ORDER — ROCURONIUM BROMIDE 50 MG/5ML IV SOLN
INTRAVENOUS | Status: AC | PRN
  Administered 2019-11-26: 80 mg via INTRAVENOUS

## 2019-11-26 MED ORDER — LACTATED RINGERS IV SOLN: INTRAVENOUS | Status: DC

## 2019-11-26 MED ORDER — ALBUTEROL SULFATE (2.5 MG/3ML) 0.083% IN NEBU
INHALATION_SOLUTION | RESPIRATORY_TRACT | Status: AC
  Administered 2019-11-26: 21:00:00 5 mg
  Filled 2019-11-26: qty 6

## 2019-11-26 MED ORDER — MIDAZOLAM HCL 2 MG/2ML IJ SOLN
2.0000 mg | INTRAMUSCULAR | Status: DC | PRN
  Administered 2019-11-26: 2 mg via INTRAVENOUS

## 2019-11-26 MED ORDER — ETOMIDATE 2 MG/ML IV SOLN
INTRAVENOUS | Status: AC | PRN
  Administered 2019-11-26: 20 mg via INTRAVENOUS

## 2019-11-26 MED ORDER — SODIUM CHLORIDE 0.9 % IV SOLN: INTRAVENOUS | Status: DC

## 2019-11-26 MED ORDER — MIDAZOLAM HCL 2 MG/2ML IJ SOLN: 2.0000 mg | INTRAMUSCULAR | Status: DC | PRN

## 2019-11-26 MED ORDER — MIDAZOLAM HCL 2 MG/2ML IJ SOLN
1.0000 mg | INTRAMUSCULAR | Status: DC | PRN
  Filled 2019-11-26: qty 2

## 2019-11-26 MED ORDER — ONDANSETRON HCL 4 MG/2ML IJ SOLN
INTRAMUSCULAR | Status: AC
  Filled 2019-11-26: qty 2

## 2019-11-26 MED ORDER — PROPOFOL 1000 MG/100ML IV EMUL
0.0000 ug/kg/min | INTRAVENOUS | Status: DC
  Administered 2019-11-26: 5 ug/kg/min via INTRAVENOUS
  Filled 2019-11-26: qty 100
  Filled 2019-11-26: qty 200

## 2019-11-26 MED ORDER — ALBUTEROL SULFATE (2.5 MG/3ML) 0.083% IN NEBU: 2.5000 mg | INHALATION_SOLUTION | RESPIRATORY_TRACT | Status: DC | PRN

## 2019-11-26 MED ORDER — DEXTROSE-NACL 5-0.45 % IV SOLN: INTRAVENOUS | Status: AC

## 2019-11-26 MED ORDER — ALBUTEROL SULFATE (2.5 MG/3ML) 0.083% IN NEBU
INHALATION_SOLUTION | RESPIRATORY_TRACT | Status: AC
  Administered 2019-11-26: 5 mg
  Filled 2019-11-26: qty 6
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
## 2019-11-26 NOTE — ED Notes (Addendum)
Patient has been decreased down to 50 percent oxygen after blood gas Saturation is 99. Et tube has been withdrawn back to 21 after last x-ray.

## 2019-11-26 NOTE — ED Notes (Signed)
Patient intubated for drug overdose and aspiration. Encountered low saturation before and after intubation. Patient inially placed on high VT 560 f 28 peep 10, 100 till oxygen saturation recovered.  Patient transition down to peep 5 , VT 480 , f28  100. Patient  has been suctioned twice with small amount blood secretions returned. Also given albuterol 5.0 mg / atrovent 0.5mg   5 [place] Progression Recent Vital Signs   BP (!) 157/120   Pulse (!) 128   Temp 98.3 F (36.8 C) (Oral)   Resp (!) 24   Ht 5\' 1"  (1.549 m)   Wt 85.3 kg   LMP 11/20/2019   SpO2 100%   BMI 35.52 kg/m    Past Medical History:  Diagnosis Date  . ADHD (attention deficit hyperactivity disorder)   . Nexplanon in place 03/14/2014     Expected Discharge Date     Diet Order    None       VTE Documentation      Work Intensity Score/Level of Care     @LEVELOFCARE @   Mobility        Significant Events    DC Barriers   Abnormal Labs:  05/15/2014 11/26/2019, 9:43 PM mg

## 2019-11-26 NOTE — ED Notes (Signed)
Date and time results received: 11/26/19 @20 :41 (use smartphrase ".now" to insert current time)  Test: lactic acid 2.2 Critical Value:   Name of Provider Notified: Dr  Orders Received? Or Actions Taken?: see emr

## 2019-11-26 NOTE — ED Provider Notes (Signed)
Procedure Name: Intubation Date/Time: 11/26/2019 9:19 PM Performed by: Sabas Sous, MD Pre-anesthesia Checklist: Patient identified, Patient being monitored, Emergency Drugs available, Timeout performed and Suction available Oxygen Delivery Method: Non-rebreather mask Preoxygenation: Pre-oxygenation with 100% oxygen Induction Type: Rapid sequence Ventilation: Mask ventilation without difficulty Laryngoscope Size: Glidescope and 3 Grade View: Grade I Tube size: 7.5 mm Number of attempts: 1 Airway Equipment and Method: Rigid stylet Placement Confirmation: ETT inserted through vocal cords under direct vision,  CO2 detector and Breath sounds checked- equal and bilateral Secured at: 22 cm Comments: Patient was in profound hypoxic respiratory failure, with oxygen saturations only 88% prior to attempted intubation.  Suspected aspiration in the setting of drug overdose.  ET tube was quickly placed, there was transient hypoxia down to the 60s, improved with bag-valve-mask and then transition to the ventilator.  Intubated using RSI 20 mg etomidate, 80 mg rocuronium.    .Critical Care Performed by: Sabas Sous, MD Authorized by: Sabas Sous, MD   Critical care provider statement:    Critical care time (minutes):  34   Critical care was necessary to treat or prevent imminent or life-threatening deterioration of the following conditions:  Respiratory failure   Critical care was time spent personally by me on the following activities:  Discussions with consultants, evaluation of patient's response to treatment, examination of patient, ordering and performing treatments and interventions, ordering and review of laboratory studies, ordering and review of radiographic studies, pulse oximetry, re-evaluation of patient's condition, obtaining history from patient or surrogate and review of old charts      Sabas Sous, MD 11/26/19 2121

## 2019-11-26 NOTE — ED Notes (Signed)
Please notify pt's grandparents Dorene Sorrow & Monia Pouch) @ [phone] for any changes in pt's condition.

## 2019-11-26 NOTE — Code Documentation (Signed)
Pt refusing intubation, Dr Pilar Plate at bedside talking with pt
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
## 2019-11-26 NOTE — Code Documentation (Signed)
Pt agrees to intubation

## 2019-11-27 ENCOUNTER — Observation Stay (HOSPITAL_COMMUNITY): Payer: Self-pay

## 2019-11-27 ENCOUNTER — Observation Stay (HOSPITAL_COMMUNITY)
Admit: 2019-11-27 | Discharge: 2019-11-27 | Disposition: A | Discharge status: Home / Self Care | Payer: Self-pay | Attending: Internal Medicine | Admitting: Internal Medicine

## 2019-11-27 DIAGNOSIS — J690 Pneumonitis due to inhalation of food and vomit: Secondary | ICD-10-CM

## 2019-11-27 DIAGNOSIS — G9340 Encephalopathy, unspecified: Secondary | ICD-10-CM

## 2019-11-27 DIAGNOSIS — F17200 Nicotine dependence, unspecified, uncomplicated: Secondary | ICD-10-CM

## 2019-11-27 DIAGNOSIS — J9601 Acute respiratory failure with hypoxia: Secondary | ICD-10-CM | POA: Diagnosis present

## 2019-11-27 DIAGNOSIS — Z720 Tobacco use: Secondary | ICD-10-CM

## 2019-11-27 LAB — BLOOD GAS, ARTERIAL
Acid-Base Excess: 1.3 mmol/L (ref 0.0–2.0)
Bicarbonate: 25.4 mmol/L (ref 20.0–28.0)
FIO2: 40
O2 Saturation: 98.6 %
Patient temperature: 37
pCO2 arterial: 43.6 mmHg (ref 32.0–48.0)
pH, Arterial: 7.389 (ref 7.350–7.450)
pO2, Arterial: 134 mmHg — ABNORMAL HIGH (ref 83.0–108.0)

## 2019-11-27 LAB — MAGNESIUM: Magnesium: 1.9 mg/dL (ref 1.7–2.4)

## 2019-11-27 LAB — HEMOGLOBIN A1C
Hgb A1c MFr Bld: 4.6 % — ABNORMAL LOW (ref 4.8–5.6)
Mean Plasma Glucose: 85.32 mg/dL

## 2019-11-27 LAB — PROCALCITONIN: Procalcitonin: 0.58 ng/mL

## 2019-11-27 LAB — HEPATITIS B SURFACE ANTIGEN: Hepatitis B Surface Ag: NONREACTIVE

## 2019-11-27 LAB — HIV ANTIBODY (ROUTINE TESTING W REFLEX): HIV Screen 4th Generation wRfx: NONREACTIVE

## 2019-11-27 LAB — HEPATITIS C ANTIBODY: HCV Ab: REACTIVE — AB

## 2019-11-27 MED ORDER — PANTOPRAZOLE SODIUM 40 MG PO TBEC
40.0000 mg | DELAYED_RELEASE_TABLET | Freq: Every day | ORAL | Status: DC
  Administered 2019-11-27 – 2019-11-28 (×2): 40 mg via ORAL
  Filled 2019-11-27 (×2): qty 1

## 2019-11-27 MED ORDER — ONDANSETRON HCL 4 MG/2ML IJ SOLN: 4.0000 mg | Freq: Four times a day (QID) | INTRAMUSCULAR | Status: DC | PRN

## 2019-11-27 MED ORDER — ACETAMINOPHEN 325 MG PO TABS
650.0000 mg | ORAL_TABLET | Freq: Four times a day (QID) | ORAL | Status: DC | PRN
  Administered 2019-11-27: 650 mg via ORAL
  Filled 2019-11-27 (×2): qty 2

## 2019-11-27 MED ORDER — ONDANSETRON HCL 4 MG PO TABS: 4.0000 mg | ORAL_TABLET | Freq: Four times a day (QID) | ORAL | Status: DC | PRN

## 2019-11-27 MED ORDER — SODIUM CHLORIDE 0.9 % IV SOLN
3.0000 g | Freq: Four times a day (QID) | INTRAVENOUS | Status: DC
  Administered 2019-11-27 – 2019-11-28 (×5): 3 g via INTRAVENOUS
  Filled 2019-11-27: qty 8
  Filled 2019-11-27: qty 3
  Filled 2019-11-27 (×2): qty 8
  Filled 2019-11-27: qty 3
  Filled 2019-11-27 (×2): qty 8

## 2019-11-27 MED ORDER — ACETAMINOPHEN 650 MG RE SUPP: 650.0000 mg | Freq: Four times a day (QID) | RECTAL | Status: DC | PRN

## 2019-11-27 MED ORDER — PROPOFOL 1000 MG/100ML IV EMUL
INTRAVENOUS | Status: AC
  Filled 2019-11-27: qty 100

## 2019-11-27 MED ORDER — ENOXAPARIN SODIUM 40 MG/0.4ML ~~LOC~~ SOLN
40.0000 mg | SUBCUTANEOUS | Status: DC
  Administered 2019-11-27: 22:00:00 40 mg via SUBCUTANEOUS
  Filled 2019-11-27: qty 0.4

## 2019-11-27 MED ORDER — NICOTINE 21 MG/24HR TD PT24
21.0000 mg | MEDICATED_PATCH | Freq: Every day | TRANSDERMAL | Status: DC
  Administered 2019-11-27: 21 mg via TRANSDERMAL
  Filled 2019-11-27 (×2): qty 1

## 2019-11-27 NOTE — ED Notes (Signed)
Pt ambulated well, o2 stayed at 94%.

## 2019-11-27 NOTE — Progress Notes (Signed)
EEG completed, results pending. 
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
## 2019-11-27 NOTE — ED Notes (Signed)
Pt awake and alert sitting up in bed.  RT at bedside at this time
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
## 2019-11-27 NOTE — Progress Notes (Signed)
Have decreased rate from 28 to 20. Patient has a long wait before traveling to [place]. Basically she most likely can be extubated before travel. Will check Blood gas before morning.

## 2019-11-27 NOTE — Procedures (Signed)
**Note De-Identified Edith Lord Obfuscation** Extubation Procedure Note  Patient Details:   Name: ANACRISTINA STEFFEK DOB: 1996-06-04 MRN: [id number]   Airway Documentation:    Vent end date: 11/27/19 Vent end time: 0910   Evaluation   O2 sats: stable throughout Complications: No apparent complications Patient did tolerate procedure well. Bilateral Breath Sounds: Diminished   Yes VS, and weaning parameters WNL, no stridor noted, +leak  Davontae Prusinski, Megan Salon 11/27/2019, 9:11 AM

## 2019-11-27 NOTE — ED Notes (Signed)
A&O x 4.  Pt talking and is able to ambulate without assistance in her room.  Given ice chips

## 2019-11-27 NOTE — Progress Notes (Signed)
Report received from Foster, RN in ED at this time.
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
## 2019-11-27 NOTE — Progress Notes (Signed)
Patient appears alert and cooperative, morning Blood gas appears normal after earlier shift rate change. Has some bright blood from suctioning of et tube by ER nurse. Breath sounds some rhonchi.  Should be able to extubate. Patient is in between MD's in [place]. CC, Ed , Hospitalist awaiting transport??? If needed.

## 2019-11-28 ENCOUNTER — Observation Stay (HOSPITAL_COMMUNITY): Payer: Self-pay

## 2019-11-28 ENCOUNTER — Inpatient Hospital Stay (HOSPITAL_COMMUNITY): Payer: Self-pay

## 2019-11-28 DIAGNOSIS — T50901A Poisoning by unspecified drugs, medicaments and biological substances, accidental (unintentional), initial encounter: Secondary | ICD-10-CM

## 2019-11-28 DIAGNOSIS — I469 Cardiac arrest, cause unspecified: Secondary | ICD-10-CM

## 2019-11-28 LAB — CBC
HCT: 37.6 % (ref 36.0–46.0)
Hemoglobin: 12.5 g/dL (ref 12.0–15.0)
MCH: 31.6 pg (ref 26.0–34.0)
MCHC: 33.2 g/dL (ref 30.0–36.0)
MCV: 94.9 fL (ref 80.0–100.0)
Platelets: 237 10*3/uL (ref 150–400)
RBC: 3.96 MIL/uL (ref 3.87–5.11)
RDW: 11.7 % (ref 11.5–15.5)
WBC: 7.3 10*3/uL (ref 4.0–10.5)
nRBC: 0 % (ref 0.0–0.2)

## 2019-11-28 LAB — COMPREHENSIVE METABOLIC PANEL
ALT: 138 U/L — ABNORMAL HIGH (ref 0–44)
AST: 61 U/L — ABNORMAL HIGH (ref 15–41)
Albumin: 3.8 g/dL (ref 3.5–5.0)
Alkaline Phosphatase: 48 U/L (ref 38–126)
Anion gap: 8 (ref 5–15)
BUN: 10 mg/dL (ref 6–20)
CO2: 24 mmol/L (ref 22–32)
Calcium: 8.6 mg/dL — ABNORMAL LOW (ref 8.9–10.3)
Chloride: 106 mmol/L (ref 98–111)
Creatinine, Ser: 0.6 mg/dL (ref 0.44–1.00)
GFR calc Af Amer: >60 mL/min (ref >60)
GFR calc non Af Amer: >60 mL/min (ref >60)
Glucose, Bld: 86 mg/dL (ref 70–99)
Potassium: 3.9 mmol/L (ref 3.5–5.1)
Sodium: 138 mmol/L (ref 135–145)
Total Bilirubin: 1.8 mg/dL — ABNORMAL HIGH (ref 0.3–1.2)
Total Protein: 6.5 g/dL (ref 6.5–8.1)

## 2019-11-28 LAB — ECHOCARDIOGRAM COMPLETE
Height: 61 in
Weight: 3008 oz

## 2019-11-28 LAB — MAGNESIUM: Magnesium: 2 mg/dL (ref 1.7–2.4)

## 2019-11-28 MED ORDER — ONDANSETRON HCL 4 MG/2ML IJ SOLN
4.0000 mg | Freq: Four times a day (QID) | INTRAMUSCULAR | Status: DC | PRN
  Administered 2019-11-28 (×2): 4 mg via INTRAVENOUS
  Filled 2019-11-28: qty 2

## 2019-11-28 MED ORDER — AMOXICILLIN-POT CLAVULANATE 875-125 MG PO TABS: 1.0000 | ORAL_TABLET | Freq: Two times a day (BID) | ORAL | 0 refills | Status: DC

## 2019-11-28 MED ORDER — ONDANSETRON HCL 4 MG PO TABS: 4.0000 mg | ORAL_TABLET | Freq: Four times a day (QID) | ORAL | Status: DC | PRN

## 2019-11-28 MED ORDER — AMOXICILLIN-POT CLAVULANATE 875-125 MG PO TABS: 1.0000 | ORAL_TABLET | Freq: Two times a day (BID) | ORAL | Status: DC

## 2019-11-28 NOTE — TOC Initial Note (Signed)
Transition of Care [facility]) - Initial/Assessment Note    Patient Details  Name: Amber Maynard MRN: [id number] Date of Birth: 05-May-1996  [facility] CM/SW Contact:    Leitha Bleak, RN Phone Number: 11/28/2019, 1:44 PM  Clinical Narrative:     Patient admitted for respiratory failure. TOC speaking with patient about [facility] due to no pcp, no Insurance. Patient gave permission to refer call for referral.  Patient also updated her cell and mother's cell number for the chart. [facility] will call patient with appointment time.              Expected Discharge Plan: Home/Self Care Barriers to Discharge: Continued Medical Work up   Patient Goals and CMS Choice Patient states their goals for this hospitalization and ongoing recovery are:: to go home CMS Medicare.gov Compare Post Acute Care list provided to:: Patient Choice offered to / list presented to : Patient  Expected Discharge Plan and Services Expected Discharge Plan: Home/Self Care      Prior Living Arrangements/Services      Criminal Activity/Legal Involvement Pertinent to Current Situation/Hospitalization: No - Comment as needed  Activities of Daily Living Home Assistive Devices/Equipment: None ADL Screening (condition at time of admission) Patient's cognitive ability adequate to safely complete daily activities?: Yes Is the patient deaf or have difficulty hearing?: No Does the patient have difficulty seeing, even when wearing glasses/contacts?: No Does the patient have difficulty concentrating, remembering, or making decisions?: No Patient able to express need for assistance with ADLs?: Yes Does the patient have difficulty dressing or bathing?: No Independently performs ADLs?: Yes (appropriate for developmental age) Does the patient have difficulty walking or climbing stairs?: No Weakness of Legs: None Weakness of Arms/Hands: None  Permission Sought/Granted    Emotional Assessment      Affect (typically observed): Accepting Orientation: : Oriented to Self, Oriented to Place, Oriented to  Time, Oriented to Situation Alcohol / Substance Use: Illicit Drugs Psych Involvement: No (comment)  Admission diagnosis:  Respiratory failure (HCC) [J96.90] RUQ abdominal pain [R10.11] Transaminasemia [R74.01] Acute respiratory failure with hypoxia (HCC) [J96.01] Accidental drug overdose, initial encounter [T50.901A] Endotracheal tube present [Z97.8] Aspiration pneumonia of both lungs due to gastric secretions, unspecified part of lung (HCC) [J69.0] Patient Active Problem List   Diagnosis Date Noted  . Acute respiratory failure with hypoxia (HCC) 11/27/2019  . Aspiration pneumonitis (HCC) 11/27/2019  . Tobacco abuse 11/27/2019  . Acute encephalopathy 11/27/2019  . Respiratory failure (HCC) 11/26/2019  . Right ovarian cyst 06/03/2016  . Nexplanon in place 03/14/2014   PCP:  Oval Linsey, MD Pharmacy:   [facility] [phone] - [place], [place] - [address] [place] [address] Phone: [phone] Fax: [phone]  [facility] [place] - [place], [place] - [address] [place] [address] Phone: [phone] Fax: [phone]

## 2019-11-28 NOTE — Progress Notes (Signed)
*  PRELIMINARY RESULTS* Echocardiogram 2D Echocardiogram has been performed.  Jeryl Columbia 11/28/2019, 3:02 PM

## 2019-11-28 NOTE — Progress Notes (Signed)
Discharge instructions provided to patient. Medications reviewed. Patient had no questions at this time.

## 2019-11-28 NOTE — Discharge Summary (Signed)
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  PLT 317 237   Cardiac Enzymes: No results for input(s): CKTOTAL, CKMB, CKMBINDEX, TROPONINI in the last 168 hours. BNP: Invalid input(s): POCBNP CBG: Recent Labs  Lab 11/26/19 1954  GLUCAP 157*    Time coordinating discharge:  36 minutes  Signed:  Catarina Hartshorn, DO [facility] Pager: [phone] 11/28/2019, 6:17 PM

## 2019-11-28 NOTE — Progress Notes (Signed)
Pt reporta vomiting X 2. Pt states when she tries to go the sleep her mouth starts watering and she vomits. No nausea.

## 2020-07-11 ENCOUNTER — Other Ambulatory Visit: Payer: Self-pay

## 2020-07-11 ENCOUNTER — Other Ambulatory Visit (INDEPENDENT_AMBULATORY_CARE_PROVIDER_SITE_OTHER): Payer: Self-pay

## 2020-07-11 ENCOUNTER — Other Ambulatory Visit (HOSPITAL_COMMUNITY)
Admission: RE | Admit: 2020-07-11 | Discharge: 2020-07-11 | Disposition: A | Discharge status: Home / Self Care | Payer: Medicaid Other | Source: Ambulatory Visit | Attending: Obstetrics & Gynecology | Admitting: Obstetrics & Gynecology

## 2020-07-11 VITALS — BP 133/75 | HR 98 | Wt 164.0 lb

## 2020-07-11 DIAGNOSIS — N898 Other specified noninflammatory disorders of vagina: Secondary | ICD-10-CM

## 2020-07-11 DIAGNOSIS — Z3201 Encounter for pregnancy test, result positive: Secondary | ICD-10-CM

## 2020-07-11 DIAGNOSIS — N926 Irregular menstruation, unspecified: Secondary | ICD-10-CM

## 2020-07-11 LAB — POCT URINE PREGNANCY: Preg Test, Ur: POSITIVE — AB

## 2020-07-11 NOTE — Progress Notes (Addendum)
   NURSE VISIT- PREGNANCY CONFIRMATION   SUBJECTIVE:  Amber Maynard is a 24 y.o. G1P0000 female at [redacted]w[redacted]d by certain LMP of Patient's last menstrual period was 05/26/2020. Here for pregnancy confirmation.  Home pregnancy test: positive x 2  She reports nausea.  She is not taking prenatal vitamins.  States she has not used drugs since she found out she was pregnant.  Also reports a dark discharge with odor that she is concerned may be an STD.  Will send CV swab.  OBJECTIVE:  BP 133/75 (BP Location: Left Arm, Patient Position: Sitting, Cuff Size: Normal)   Pulse 98   Wt 164 lb (74.4 kg)   LMP 05/26/2020   BMI 30.99 kg/m   Appears well, in no apparent distress OB History  Gravida Para Term Preterm AB Living  1 0 0 0 0 0  SAB TAB Ectopic Multiple Live Births  0 0 0 0 0    # Outcome Date GA Lbr Len/2nd Weight Sex Delivery Anes PTL Lv  1 Current             Results for orders placed or performed in visit on 07/11/20 (from the past 24 hour(s))  POCT urine pregnancy   Collection Time: 07/11/20  4:34 PM  Result Value Ref Range   Preg Test, Ur Positive (A) Negative    ASSESSMENT: Positive pregnancy test, [redacted]w[redacted]d by LMP    PLAN: Schedule for dating ultrasound in 2 weeks Prenatal vitamins: plans to begin OTC ASAP   Nausea medicines: not currently needed   OB packet given: Yes  CV swab sent  Jobe Marker  07/11/2020 4:34 PM  Chart reviewed for nurse visit. Agree with plan of care.  Jacklyn Shell, [place] 07/11/2020 7:16 PM

## 2020-07-15 ENCOUNTER — Telehealth: Payer: Self-pay | Admitting: *Deleted

## 2020-07-15 ENCOUNTER — Other Ambulatory Visit: Payer: Self-pay | Admitting: Advanced Practice Midwife

## 2020-07-15 LAB — CERVICOVAGINAL ANCILLARY ONLY
Bacterial Vaginitis (gardnerella): POSITIVE — AB
Chlamydia: NEGATIVE
Comment: NEGATIVE
Comment: NEGATIVE
Comment: NEGATIVE
Comment: NORMAL
Neisseria Gonorrhea: NEGATIVE
Trichomonas: POSITIVE — AB

## 2020-07-15 MED ORDER — METRONIDAZOLE 500 MG PO TABS: 500.0000 mg | ORAL_TABLET | Freq: Two times a day (BID) | ORAL | 0 refills | Status: DC

## 2020-07-15 NOTE — Telephone Encounter (Signed)
Call can't be completed @ 4:13 pm. Will try again later. JSY

## 2020-07-15 NOTE — Progress Notes (Signed)
Flagyl for BV and trich.  Will let me know if I need to treat her partner

## 2020-07-16 NOTE — Telephone Encounter (Signed)
Call can't be completed @ 5:14 pm. JSY

## 2020-07-16 NOTE — Telephone Encounter (Signed)
Call can't be completed @ 9:44 am. No other # listed. Will try again later. JSY

## 2020-07-17 ENCOUNTER — Encounter: Payer: Self-pay | Admitting: *Deleted

## 2020-07-17 NOTE — Telephone Encounter (Signed)
Call can't be completed at 1201.

## 2020-07-17 NOTE — Telephone Encounter (Signed)
Certified letter to patient

## 2020-07-20 ENCOUNTER — Other Ambulatory Visit: Payer: Self-pay

## 2020-07-20 ENCOUNTER — Emergency Department (HOSPITAL_COMMUNITY)
Admission: EM | Admit: 2020-07-20 | Discharge: 2020-07-20 | Disposition: A | Discharge status: Home / Self Care | Payer: Medicaid Other | Attending: Emergency Medicine | Admitting: Emergency Medicine

## 2020-07-20 ENCOUNTER — Encounter (HOSPITAL_COMMUNITY): Payer: Self-pay | Admitting: *Deleted

## 2020-07-20 DIAGNOSIS — Z3A08 8 weeks gestation of pregnancy: Secondary | ICD-10-CM | POA: Insufficient documentation

## 2020-07-20 DIAGNOSIS — O219 Vomiting of pregnancy, unspecified: Secondary | ICD-10-CM | POA: Diagnosis present

## 2020-07-20 DIAGNOSIS — Z5321 Procedure and treatment not carried out due to patient leaving prior to being seen by health care provider: Secondary | ICD-10-CM | POA: Diagnosis not present

## 2020-07-20 LAB — CBC
HCT: 43.6 % (ref 36.0–46.0)
Hemoglobin: 14.5 g/dL (ref 12.0–15.0)
MCH: 30.9 pg (ref 26.0–34.0)
MCHC: 33.3 g/dL (ref 30.0–36.0)
MCV: 92.8 fL (ref 80.0–100.0)
Platelets: 379 10*3/uL (ref 150–400)
RBC: 4.7 MIL/uL (ref 3.87–5.11)
RDW: 12.1 % (ref 11.5–15.5)
WBC: 9.2 10*3/uL (ref 4.0–10.5)
nRBC: 0 % (ref 0.0–0.2)

## 2020-07-20 LAB — COMPREHENSIVE METABOLIC PANEL
ALT: 69 U/L — ABNORMAL HIGH (ref 0–44)
AST: 33 U/L (ref 15–41)
Albumin: 4.7 g/dL (ref 3.5–5.0)
Alkaline Phosphatase: 62 U/L (ref 38–126)
Anion gap: 9 (ref 5–15)
BUN: 9 mg/dL (ref 6–20)
CO2: 26 mmol/L (ref 22–32)
Calcium: 9.6 mg/dL (ref 8.9–10.3)
Chloride: 98 mmol/L (ref 98–111)
Creatinine, Ser: 0.69 mg/dL (ref 0.44–1.00)
GFR, Estimated: >60 mL/min (ref >60)
Glucose, Bld: 103 mg/dL — ABNORMAL HIGH (ref 70–99)
Potassium: 3.5 mmol/L (ref 3.5–5.1)
Sodium: 133 mmol/L — ABNORMAL LOW (ref 135–145)
Total Bilirubin: 1 mg/dL (ref 0.3–1.2)
Total Protein: 8.4 g/dL — ABNORMAL HIGH (ref 6.5–8.1)

## 2020-07-20 LAB — LIPASE, BLOOD: Lipase: 29 U/L (ref 11–51)

## 2020-07-20 NOTE — ED Triage Notes (Signed)
[redacted] weeks pregnant, states she has been vomiting for the past 2 days, unable to keep anything down

## 2020-07-21 ENCOUNTER — Emergency Department (HOSPITAL_COMMUNITY)
Admission: EM | Admit: 2020-07-21 | Discharge: 2020-07-22 | Discharge status: Against Medical Advice | Payer: Medicaid Other | Attending: Emergency Medicine | Admitting: Emergency Medicine

## 2020-07-21 ENCOUNTER — Other Ambulatory Visit: Payer: Self-pay

## 2020-07-21 ENCOUNTER — Encounter (HOSPITAL_COMMUNITY): Payer: Self-pay | Admitting: Emergency Medicine

## 2020-07-21 DIAGNOSIS — Z3A00 Weeks of gestation of pregnancy not specified: Secondary | ICD-10-CM | POA: Insufficient documentation

## 2020-07-21 DIAGNOSIS — O211 Hyperemesis gravidarum with metabolic disturbance: Secondary | ICD-10-CM | POA: Diagnosis not present

## 2020-07-21 DIAGNOSIS — O219 Vomiting of pregnancy, unspecified: Secondary | ICD-10-CM | POA: Diagnosis present

## 2020-07-21 DIAGNOSIS — F17210 Nicotine dependence, cigarettes, uncomplicated: Secondary | ICD-10-CM | POA: Diagnosis not present

## 2020-07-21 DIAGNOSIS — O210 Mild hyperemesis gravidarum: Secondary | ICD-10-CM

## 2020-07-21 MED ORDER — SODIUM CHLORIDE 0.9 % IV BOLUS
1000.0000 mL | Freq: Once | INTRAVENOUS | Status: AC
  Administered 2020-07-22: 1000 mL via INTRAVENOUS

## 2020-07-21 MED ORDER — ONDANSETRON HCL 4 MG/2ML IJ SOLN
4.0000 mg | Freq: Once | INTRAMUSCULAR | Status: AC
  Administered 2020-07-22: 4 mg via INTRAVENOUS
  Filled 2020-07-21: qty 2

## 2020-07-21 NOTE — ED Triage Notes (Signed)
Pt states she is about [redacted] weeks pregnant and has been vomiting for several days now. Called OB/GYN and was told to come to ED for eval.

## 2020-07-22 ENCOUNTER — Telehealth: Payer: Self-pay | Admitting: Obstetrics & Gynecology

## 2020-07-22 LAB — CBC WITH DIFFERENTIAL/PLATELET
Abs Immature Granulocytes: 0.02 10*3/uL (ref 0.00–0.07)
Basophils Absolute: 0.1 10*3/uL (ref 0.0–0.1)
Basophils Relative: 1 %
Eosinophils Absolute: 0.1 10*3/uL (ref 0.0–0.5)
Eosinophils Relative: 1 %
HCT: 42.5 % (ref 36.0–46.0)
Hemoglobin: 14.9 g/dL (ref 12.0–15.0)
Immature Granulocytes: 0 %
Lymphocytes Relative: 32 %
Lymphs Abs: 3 10*3/uL (ref 0.7–4.0)
MCH: 31.2 pg (ref 26.0–34.0)
MCHC: 35.1 g/dL (ref 30.0–36.0)
MCV: 89.1 fL (ref 80.0–100.0)
Monocytes Absolute: 0.7 10*3/uL (ref 0.1–1.0)
Monocytes Relative: 8 %
Neutro Abs: 5.6 10*3/uL (ref 1.7–7.7)
Neutrophils Relative %: 58 %
Platelets: 348 10*3/uL (ref 150–400)
RBC: 4.77 MIL/uL (ref 3.87–5.11)
RDW: 12 % (ref 11.5–15.5)
WBC: 9.6 10*3/uL (ref 4.0–10.5)
nRBC: 0 % (ref 0.0–0.2)

## 2020-07-22 LAB — URINALYSIS, ROUTINE W REFLEX MICROSCOPIC
Bacteria, UA: NONE SEEN
Bilirubin Urine: NEGATIVE
Glucose, UA: NEGATIVE mg/dL
Hgb urine dipstick: NEGATIVE
Ketones, ur: 20 mg/dL — AB
Nitrite: NEGATIVE
Protein, ur: NEGATIVE mg/dL
Specific Gravity, Urine: 1.021 (ref 1.005–1.030)
WBC, UA: >50 WBC/hpf — ABNORMAL HIGH (ref 0–5)
pH: 5 (ref 5.0–8.0)

## 2020-07-22 LAB — COMPREHENSIVE METABOLIC PANEL
ALT: 63 U/L — ABNORMAL HIGH (ref 0–44)
AST: 33 U/L (ref 15–41)
Albumin: 4.7 g/dL (ref 3.5–5.0)
Alkaline Phosphatase: 58 U/L (ref 38–126)
Anion gap: 11 (ref 5–15)
BUN: 12 mg/dL (ref 6–20)
CO2: 26 mmol/L (ref 22–32)
Calcium: 9.6 mg/dL (ref 8.9–10.3)
Chloride: 96 mmol/L — ABNORMAL LOW (ref 98–111)
Creatinine, Ser: 0.62 mg/dL (ref 0.44–1.00)
GFR, Estimated: >60 mL/min (ref >60)
Glucose, Bld: 84 mg/dL (ref 70–99)
Potassium: 3.1 mmol/L — ABNORMAL LOW (ref 3.5–5.1)
Sodium: 133 mmol/L — ABNORMAL LOW (ref 135–145)
Total Bilirubin: 1.5 mg/dL — ABNORMAL HIGH (ref 0.3–1.2)
Total Protein: 8.4 g/dL — ABNORMAL HIGH (ref 6.5–8.1)

## 2020-07-22 LAB — LACTIC ACID, PLASMA: Lactic Acid, Venous: 1.2 mmol/L (ref 0.5–1.9)

## 2020-07-22 MED ORDER — ONDANSETRON HCL 4 MG/2ML IJ SOLN
4.0000 mg | Freq: Once | INTRAMUSCULAR | Status: AC
  Administered 2020-07-22: 4 mg via INTRAVENOUS
  Filled 2020-07-22: qty 2

## 2020-07-22 MED ORDER — SODIUM CHLORIDE 0.9 % IV BOLUS
1000.0000 mL | Freq: Once | INTRAVENOUS | Status: AC
  Administered 2020-07-22: 1000 mL via INTRAVENOUS

## 2020-07-22 MED ORDER — POTASSIUM CHLORIDE CRYS ER 20 MEQ PO TBCR
40.0000 meq | EXTENDED_RELEASE_TABLET | Freq: Once | ORAL | Status: AC
  Administered 2020-07-22: 40 meq via ORAL
  Filled 2020-07-22: qty 2

## 2020-07-22 NOTE — Telephone Encounter (Signed)
Patient called stating that she got the letter that she tested positive for BV and Trich patient states that she had two sexual partner and she would like for [place] to call them in something to treat them as well. Partner number one is Yvone Neu DOS: 04/[birth year]/1969 and he uses [facility] on [address]. Partner two is Glendale Chard  DOB: 08/22/1983 and he uses [facility] on [address] as well.

## 2020-07-22 NOTE — ED Provider Notes (Signed)
[facility] EMERGENCY DEPARTMENT Provider Note   CSN: [id number] Arrival date & time: 07/21/20  1855   Time seen 12:06 AM  History Chief Complaint  Patient presents with  . Emesis    Amber Maynard is a 24 y.o. female.  HPI   Patient is G1, P0 Ab0, last normal.  Around September 6.  Patient states she did a home pregnancy test about 1 to 2 weeks ago that was positive.  She had been doing IV heroin and states she states she stopped cold [place] at that time.  [facility] and is [facility] to have her first ultrasound done on November 11.  She states she is having lots of nausea and vomiting about 4-5 episodes a day.  She denies seeing any blood.  She denies diarrhea or abdominal pain.  She states a few days ago she started feeling dizzy and lightheaded.  She denies any vaginal bleeding or abdominal pain.  PCP Oval Linsey, MD [facility]  Past Medical History:  Diagnosis Date  . ADHD (attention deficit hyperactivity disorder)   . Hepatitis C   . Nexplanon in place 03/14/2014    Patient Active Problem List   Diagnosis Date Noted  . Accidental drug overdose   . Acute respiratory failure with hypoxia (HCC) 11/27/2019  . Aspiration pneumonitis (HCC) 11/27/2019  . Tobacco abuse 11/27/2019  . Acute encephalopathy 11/27/2019  . Respiratory failure (HCC) 11/26/2019  . Right ovarian cyst 06/03/2016  . Nexplanon in place 03/14/2014    History reviewed. No pertinent surgical history.   OB History    Gravida  1   Para  0   Term  0   Preterm  0   AB  0   Living  0     SAB  0   TAB  0   Ectopic  0   Multiple  0   Live Births  0           History reviewed. No pertinent family history.  Social History   Tobacco Use  . Smoking status: Current Every Day Smoker    Packs/day: 0.50    Types: Cigarettes  . Smokeless tobacco: Never Used  Vaping Use  . Vaping Use: Never used  Substance Use Topics  . Alcohol use: No  . Drug use: Not  Currently    Types: Oxycodone, Heroin    Home Medications Prior to Admission medications   Medication Sig Start Date End Date Taking? Authorizing Provider  amphetamine-dextroamphetamine (ADDERALL) 20 MG tablet Take 20 mg by mouth daily.    [provider]  buPROPion (WELLBUTRIN) 75 MG tablet Take 75 mg by mouth 2 (two) times daily.    [provider]  metroNIDAZOLE (FLAGYL) 500 MG tablet Take 1 tablet (500 mg total) by mouth 2 (two) times daily. 07/15/20   Cresenzo-Dishmon, Scarlette Calico, CNM  omeprazole (PRILOSEC) 20 MG capsule Take 20 mg by mouth daily.    [provider]  traZODone (DESYREL) 50 MG tablet Take 50 mg by mouth at bedtime.    [provider]    Allergies    Adhesive [tape] and Bee venom  Review of Systems   Review of Systems  All other systems reviewed and are negative.   Physical Exam Updated Vital Signs BP (!) 120/58   Pulse 70   Temp 97.8 F (36.6 C) (Oral)   Resp 20   Ht 5\' 1"  (1.549 m)   Wt 77.1 kg  LMP 05/26/2020   SpO2 100%   BMI 32.12 kg/m   Physical Exam Vitals and nursing note reviewed.  Constitutional:      General: She is not in acute distress.    Appearance: Normal appearance. She is normal weight.  HENT:     Head: Normocephalic and atraumatic.     Right Ear: External ear normal.     Left Ear: External ear normal.     Nose: Nose normal.     Mouth/Throat:     Mouth: Mucous membranes are moist.     Pharynx: No oropharyngeal exudate or posterior oropharyngeal erythema.  Eyes:     Extraocular Movements: Extraocular movements intact.     Conjunctiva/sclera: Conjunctivae normal.     Pupils: Pupils are equal, round, and reactive to light.  Cardiovascular:     Rate and Rhythm: Normal rate and regular rhythm.     Pulses: Normal pulses.     Heart sounds: Normal heart sounds.  Pulmonary:     Effort: Pulmonary effort is normal.     Breath sounds: Normal breath sounds.  Abdominal:     General: Abdomen is flat.  Bowel sounds are normal.     Palpations: Abdomen is soft.     Tenderness: There is no abdominal tenderness.  Musculoskeletal:        General: Normal range of motion.     Cervical back: Normal range of motion.  Skin:    General: Skin is warm and dry.  Neurological:     General: No focal deficit present.     Mental Status: She is alert and oriented to person, place, and time.     Cranial Nerves: No cranial nerve deficit.  Psychiatric:        Mood and Affect: Mood normal.        Behavior: Behavior normal.        Thought Content: Thought content normal.     ED Results / Procedures / Treatments   Labs (all labs ordered are listed, but only abnormal results are displayed) Results for orders placed or performed during the hospital encounter of 07/21/20  Comprehensive metabolic panel  Result Value Ref Range   Sodium 133 (L) 135 - 145 mmol/L   Potassium 3.1 (L) 3.5 - 5.1 mmol/L   Chloride 96 (L) 98 - 111 mmol/L   CO2 26 22 - 32 mmol/L   Glucose, Bld 84 70 - 99 mg/dL   BUN 12 6 - 20 mg/dL   Creatinine, Ser 8.78 0.44 - 1.00 mg/dL   Calcium 9.6 8.9 - 67.6 mg/dL   Total Protein 8.4 (H) 6.5 - 8.1 g/dL   Albumin 4.7 3.5 - 5.0 g/dL   AST 33 15 - 41 U/L   ALT 63 (H) 0 - 44 U/L   Alkaline Phosphatase 58 38 - 126 U/L   Total Bilirubin 1.5 (H) 0.3 - 1.2 mg/dL   GFR, Estimated >72 >09 mL/min   Anion gap 11 5 - 15  CBC with Differential  Result Value Ref Range   WBC 9.6 4.0 - 10.5 K/uL   RBC 4.77 3.87 - 5.11 MIL/uL   Hemoglobin 14.9 12.0 - 15.0 g/dL   HCT 47.0 36 - 46 %   MCV 89.1 80.0 - 100.0 fL   MCH 31.2 26.0 - 34.0 pg   MCHC 35.1 30.0 - 36.0 g/dL   RDW 96.2 83.6 - 62.9 %   Platelets 348 150 - 400 K/uL   nRBC 0.0 0.0 - 0.2 %  Neutrophils Relative % 58 %   Neutro Abs 5.6 1.7 - 7.7 K/uL   Lymphocytes Relative 32 %   Lymphs Abs 3.0 0.7 - 4.0 K/uL   Monocytes Relative 8 %   Monocytes Absolute 0.7 0.1 - 1.0 K/uL   Eosinophils Relative 1 %   Eosinophils Absolute 0.1 0.0 - 0.5 K/uL    Basophils Relative 1 %   Basophils Absolute 0.1 0.0 - 0.1 K/uL   Immature Granulocytes 0 %   Abs Immature Granulocytes 0.02 0.00 - 0.07 K/uL  Lactic acid, plasma  Result Value Ref Range   Lactic Acid, Venous 1.2 0.5 - 1.9 mmol/L  Urinalysis, Routine w reflex microscopic Urine, Clean Catch  Result Value Ref Range   Color, Urine YELLOW YELLOW   APPearance HAZY (A) CLEAR   Specific Gravity, Urine 1.021 1.005 - 1.030   pH 5.0 5.0 - 8.0   Glucose, UA NEGATIVE NEGATIVE mg/dL   Hgb urine dipstick NEGATIVE NEGATIVE   Bilirubin Urine NEGATIVE NEGATIVE   Ketones, ur 20 (A) NEGATIVE mg/dL   Protein, ur NEGATIVE NEGATIVE mg/dL   Nitrite NEGATIVE NEGATIVE   Leukocytes,Ua TRACE (A) NEGATIVE   RBC / HPF 0-5 0 - 5 RBC/hpf   WBC, UA >50 (H) 0 - 5 WBC/hpf   Bacteria, UA NONE SEEN NONE SEEN   Squamous Epithelial / LPF 11-20 0 - 5   Mucus PRESENT    Laboratory interpretation all normal except contaminated urine, hypokalemia    EKG None  Radiology No results found.  Procedures Procedures (including critical care time)  Medications Ordered in ED Medications  sodium chloride 0.9 % bolus 1,000 mL (0 mLs Intravenous Stopped 07/22/20 0232)  ondansetron (ZOFRAN) injection 4 mg (4 mg Intravenous Given 07/22/20 0137)  sodium chloride 0.9 % bolus 1,000 mL (0 mLs Intravenous Stopped 07/22/20 0400)  potassium chloride SA (KLOR-CON) CR tablet 40 mEq (40 mEq Oral Given 07/22/20 0313)  ondansetron (ZOFRAN) injection 4 mg (4 mg Intravenous Given 07/22/20 0240)    ED Course  I have reviewed the triage vital signs and the nursing notes.  Pertinent labs & imaging results that were available during my care of the patient were reviewed by me and considered in my medical decision making (see chart for details).    MDM Rules/Calculators/A&P                          Patient was given IV fluids and IV nausea medication.  Recheck at 2:15 AM patient has finished her liter of fluids.  She has not had  urinary output yet.  She is willing to try oral potassium and would like to try oral fluids now.  She was given a second liter of normal saline.  2:30 AM nurse reports patient took a sip of water and immediately vomited.  She was given an additional dose of Zofran.  4:00 AM nursing staff informed me while we were dealing with an emergency patient patient said she had to leave because her ride had to go.  Her IV was removed by nursing tech.  [facility].  Final Clinical Impression(s) / ED Diagnoses Final diagnoses:  Hyperemesis gravidarum    Rx / DC Orders  Pt left AMA   Devoria Albe, MD, Concha Pyo, MD 07/22/20 [phone]

## 2020-07-22 NOTE — Telephone Encounter (Signed)
Called and left message that I need to know if partners have allergies.

## 2020-07-23 ENCOUNTER — Encounter: Payer: Self-pay | Admitting: Women's Health

## 2020-07-23 ENCOUNTER — Other Ambulatory Visit: Payer: Self-pay | Admitting: Women's Health

## 2020-07-23 DIAGNOSIS — F1990 Other psychoactive substance use, unspecified, uncomplicated: Secondary | ICD-10-CM | POA: Insufficient documentation

## 2020-07-23 DIAGNOSIS — F1191 Opioid use, unspecified, in remission: Secondary | ICD-10-CM | POA: Insufficient documentation

## 2020-07-23 DIAGNOSIS — A599 Trichomoniasis, unspecified: Secondary | ICD-10-CM | POA: Insufficient documentation

## 2020-07-23 NOTE — Telephone Encounter (Signed)
Treatment called in for both partners.

## 2020-07-23 NOTE — Telephone Encounter (Signed)
Patient states that there are no allergies for both partners.

## 2020-07-23 NOTE — Progress Notes (Signed)
Metronidazole 2gm po x 1 called into [facility] [address] for 2 partners. Yvone Neu DOS: 04/29/1967 and Glendale Chard  DOB: 08/22/1981  Cheral Marker, CNM, WHNP-BC 07/23/2020 2:25 PM

## 2020-07-24 ENCOUNTER — Other Ambulatory Visit: Payer: Self-pay | Admitting: Obstetrics & Gynecology

## 2020-07-24 DIAGNOSIS — O3680X0 Pregnancy with inconclusive fetal viability, not applicable or unspecified: Secondary | ICD-10-CM

## 2020-07-25 ENCOUNTER — Other Ambulatory Visit: Payer: Medicaid Other

## 2020-07-25 ENCOUNTER — Ambulatory Visit (INDEPENDENT_AMBULATORY_CARE_PROVIDER_SITE_OTHER): Payer: Medicaid Other

## 2020-07-25 DIAGNOSIS — O3680X0 Pregnancy with inconclusive fetal viability, not applicable or unspecified: Secondary | ICD-10-CM

## 2020-07-25 DIAGNOSIS — Z3A09 9 weeks gestation of pregnancy: Secondary | ICD-10-CM

## 2020-07-25 NOTE — Progress Notes (Signed)
[place] 7+3 wks,single IUP with YS,positive FHT 140 bpm,CRL 12.07 mm,normal ovaries

## 2020-08-16 ENCOUNTER — Telehealth: Payer: Self-pay | Admitting: Advanced Practice Midwife

## 2020-08-16 MED ORDER — ONDANSETRON 4 MG PO TBDP: 4.0000 mg | ORAL_TABLET | Freq: Three times a day (TID) | ORAL | 1 refills | Status: DC | PRN

## 2020-08-16 NOTE — Addendum Note (Signed)
Addended by: Cyril Mourning A on: 08/16/2020 02:41 PM   Modules accepted: Orders

## 2020-08-16 NOTE — Telephone Encounter (Signed)
Pt aware that zofran rx sent to [facility]

## 2020-08-16 NOTE — Telephone Encounter (Signed)
Pt is [redacted]wks pregnant & having lots of vomiting, unable to keep anything down  States the EMS came & assessed her on 08/14/2020, said she probably has an esophogeal tear as it can be common with excessive vomiting & she'd didn't go to the ED due to long wait times  Would like Zofran called in   Please advise & call pt (pt's aunt called & relayed all info from pt)   [address]

## 2020-08-23 ENCOUNTER — Other Ambulatory Visit: Payer: Self-pay | Admitting: Obstetrics & Gynecology

## 2020-08-23 DIAGNOSIS — Z3682 Encounter for antenatal screening for nuchal translucency: Secondary | ICD-10-CM

## 2020-08-26 ENCOUNTER — Ambulatory Visit (INDEPENDENT_AMBULATORY_CARE_PROVIDER_SITE_OTHER): Payer: Medicaid Other | Admitting: Women's Health

## 2020-08-26 ENCOUNTER — Other Ambulatory Visit: Payer: Self-pay

## 2020-08-26 ENCOUNTER — Encounter: Payer: Self-pay | Admitting: Women's Health

## 2020-08-26 ENCOUNTER — Other Ambulatory Visit (HOSPITAL_COMMUNITY)
Admission: RE | Admit: 2020-08-26 | Discharge: 2020-08-26 | Disposition: A | Discharge status: Home / Self Care | Payer: Medicaid Other | Source: Ambulatory Visit | Attending: Obstetrics & Gynecology | Admitting: Obstetrics & Gynecology

## 2020-08-26 ENCOUNTER — Ambulatory Visit (INDEPENDENT_AMBULATORY_CARE_PROVIDER_SITE_OTHER): Payer: Medicaid Other

## 2020-08-26 VITALS — BP 127/84 | HR 91 | Wt 140.0 lb

## 2020-08-26 DIAGNOSIS — Z3400 Encounter for supervision of normal first pregnancy, unspecified trimester: Secondary | ICD-10-CM | POA: Insufficient documentation

## 2020-08-26 DIAGNOSIS — A599 Trichomoniasis, unspecified: Secondary | ICD-10-CM

## 2020-08-26 DIAGNOSIS — Z3401 Encounter for supervision of normal first pregnancy, first trimester: Secondary | ICD-10-CM

## 2020-08-26 DIAGNOSIS — F17200 Nicotine dependence, unspecified, uncomplicated: Secondary | ICD-10-CM

## 2020-08-26 DIAGNOSIS — F1990 Other psychoactive substance use, unspecified, uncomplicated: Secondary | ICD-10-CM

## 2020-08-26 DIAGNOSIS — Z3682 Encounter for antenatal screening for nuchal translucency: Secondary | ICD-10-CM

## 2020-08-26 DIAGNOSIS — Z3402 Encounter for supervision of normal first pregnancy, second trimester: Secondary | ICD-10-CM

## 2020-08-26 DIAGNOSIS — Z331 Pregnant state, incidental: Secondary | ICD-10-CM

## 2020-08-26 DIAGNOSIS — Z1389 Encounter for screening for other disorder: Secondary | ICD-10-CM

## 2020-08-26 DIAGNOSIS — Z3A12 12 weeks gestation of pregnancy: Secondary | ICD-10-CM

## 2020-08-26 DIAGNOSIS — B1920 Unspecified viral hepatitis C without hepatic coma: Secondary | ICD-10-CM | POA: Insufficient documentation

## 2020-08-26 LAB — POCT URINALYSIS DIPSTICK OB
Blood, UA: NEGATIVE
Glucose, UA: NEGATIVE
Ketones, UA: NEGATIVE
Leukocytes, UA: NEGATIVE
Nitrite, UA: NEGATIVE
POC,PROTEIN,UA: NEGATIVE

## 2020-08-26 LAB — OB RESULTS CONSOLE GBS: GBS: POSITIVE

## 2020-08-26 NOTE — Progress Notes (Signed)
[place] 12 wks,measurements c/w dates,CRL 55.35 mm,NT 3.7 mm,NB present,normal ovaries,FHR 150 bpm,posterior placenta

## 2020-08-26 NOTE — Progress Notes (Signed)
INITIAL OBSTETRICAL VISIT Patient name: Amber Maynard MRN [id number]  Date of birth: 01/21/1996 Chief Complaint:   Initial Prenatal Visit (Nt/it)  History of Present Illness:   Amber Maynard is a 24 y.o. G107P0000 Caucasian female at [redacted]w[redacted]d by [place] at 7 weeks with an Estimated Date of Delivery: 03/10/21 being seen today for her initial obstetrical visit.   Her obstetrical history is significant for primigravida.   Today she reports n/v, zofran helps.  Depression screen PHQ 2/9 08/26/2020  Decreased Interest 1  Down, Depressed, Hopeless 1  PHQ - 2 Score 2  Altered sleeping 1  Tired, decreased energy 1  Change in appetite 2  Feeling bad or failure about yourself  1  Trouble concentrating 0  Moving slowly or fidgety/restless 0  Suicidal thoughts 0  PHQ-9 Score 7    Patient's last menstrual period was 05/21/2020. Last pap never. Results were: n/a Review of Systems:   Pertinent items are noted in HPI Denies cramping/contractions, leakage of fluid, vaginal bleeding, abnormal vaginal discharge w/ itching/odor/irritation, headaches, visual changes, shortness of breath, chest pain, abdominal pain, severe nausea/vomiting, or problems with urination or bowel movements unless otherwise stated above.  Pertinent History Reviewed:  Reviewed past medical,surgical, social, obstetrical and family history.  Reviewed problem list, medications and allergies. OB History  Gravida Para Term Preterm AB Living  1 0 0 0 0 0  SAB IAB Ectopic Multiple Live Births  0 0 0 0 0    # Outcome Date GA Lbr Len/2nd Weight Sex Delivery Anes PTL Lv  1 Current            Physical Assessment:   Vitals:   08/26/20 1034  BP: 127/84  Pulse: 91  Weight: 140 lb (63.5 kg)  Body mass index is 26.45 kg/m.       Physical Examination:  General appearance - well appearing, and in no distress  Mental status - alert, oriented to person, place, and time  Psych:  She has a normal mood and affect  Skin - warm and  dry, normal color, no suspicious lesions noted  Chest - effort normal, all lung fields clear to auscultation bilaterally  Heart - normal rate and regular rhythm  Abdomen - soft, nontender  Extremities:  No swelling or varicosities noted  Pelvic - VULVA: normal appearing vulva with no masses, tenderness or lesions  VAGINA: normal appearing vagina with normal color and discharge, no lesions  CERVIX: normal appearing cervix without discharge or lesions, no CMT  Thin prep pap is done w/ HR HPV cotesting  Chaperone: Latisha Cresenzo    TODAY'S NT [place] 12 wks,measurements c/w dates,CRL 55.35 mm,NT 3.7 mm,NB present,normal ovaries,FHR 150 bpm,posterior placenta   Results for orders placed or performed in visit on 08/26/20 (from the past 24 hour(s))  POC Urinalysis Dipstick OB   Collection Time: 08/26/20 11:12 AM  Result Value Ref Range   Color, UA     Clarity, UA     Glucose, UA Negative Negative   Bilirubin, UA     Ketones, UA neg    Spec Grav, UA     Blood, UA neg    pH, UA     POC,PROTEIN,UA Negative Negative, Trace, Small (1+), Moderate (2+), Large (3+), 4+   Urobilinogen, UA     Nitrite, UA neg    Leukocytes, UA Negative Negative   Appearance     Odor      Assessment & Plan:  1) Low-Risk Pregnancy G1P0000 at [redacted]w[redacted]d  with an Estimated Date of Delivery: 03/10/21   2) Initial OB visit  3) 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 Offered QuitlineNC, declined, feels she can quit on her own  6) Thickened NT> discussed and gave printed info, Panorama today  7) Recent trichomonas> POC today  Meds: No orders of the defined types were placed in this encounter.   Initial labs obtained Continue prenatal vitamins Reviewed n/v relief measures and warning s/s to report Reviewed recommended weight gain based  on pre-gravid BMI Encouraged well-balanced diet Genetic & carrier screening discussed: requests Panorama, NT/IT and Horizon 14  Ultrasound discussed; fetal survey: requested CCNC completed> form faxed if has or is planning to apply for medicaid The nature of [facility] [employer] with multiple MDs and other Advanced Practice Providers was explained to patient; also emphasized that fellows, residents, and students are part of our team. Has home bp cuff.  Check bp weekly, let us know if >140/90.  [facility] offered, accepted, referral faxed   Follow-up: Return in about 3 weeks (around 09/16/2020) for LROB, 2nd IT, in person, CNM.   Orders Placed This Encounter  Procedures  . Urine Culture  . Integrated 1  . Genetic Screening  . CBC/D/Plt+RPR+Rh+ABO+Rub Ab...  . Pain Management Screening Profile (10S)  . POC Urinalysis Dipstick OB    Cheral Marker CNM, [facility] 08/26/2020 1:01 PM

## 2020-08-26 NOTE — Patient Instructions (Signed)
Amber Maynard, I greatly value your feedback.  If you receive a survey following your visit with [place] today, we appreciate you taking the time to fill it out.  Thanks, Joellyn Haff, CNM, WHNP-BC   [facility] ([address] [place], [place] [address]) Entrance C, located off of [address] 24/7 valet parking   Nausea & Vomiting  Have saltine crackers or pretzels by your bed and eat a few bites before you raise your head out of bed in the morning  Eat small frequent meals throughout the day instead of large meals  Drink plenty of fluids throughout the day to stay hydrated, just don't drink a lot of fluids with your meals.  This can make your stomach fill up faster making you feel sick  Do not brush your teeth right after you eat  Products with real ginger are good for nausea, like ginger ale and ginger hard candy Make sure it says made with real ginger!  Sucking on sour candy like lemon heads is also good for nausea  If your prenatal vitamins make you nauseated, take them at night so you will sleep through the nausea  Sea Bands  If you feel like you need medicine for the nausea & vomiting please let us know  If you are unable to keep any fluids or food down please let us know   Constipation  Drink plenty of fluid, preferably water, throughout the day  Eat foods high in fiber such as fruits, vegetables, and grains  Exercise, such as walking, is a good way to keep your bowels regular  Drink warm fluids, especially warm prune juice, or decaf coffee  Eat a 1/2 cup of real oatmeal (not instant), 1/2 cup applesauce, and 1/2-1 cup warm prune juice every day  If needed, you may take Colace (docusate sodium) stool softener once or twice a day to help keep the stool soft.   If you still are having problems with constipation, you may take Miralax once daily as needed to help keep your bowels regular.   Home Blood Pressure Monitoring for Patients    Your provider has recommended that you check your blood pressure (BP) at least once a week at home. If you do not have a blood pressure cuff at home, one will be provided for you. Contact your provider if you have not received your monitor within 1 week.   Helpful Tips for Accurate Home Blood Pressure Checks  . Don't smoke, exercise, or drink caffeine 30 minutes before checking your BP . Use the restroom before checking your BP (a full bladder can raise your pressure) . Relax in a comfortable upright chair . Feet on the ground . Left arm resting comfortably on a flat surface at the level of your heart . Legs uncrossed . Back supported . Sit quietly and don't talk . Place the cuff on your bare arm . Adjust snuggly, so that only two fingertips can fit between your skin and the top of the cuff . Check 2 readings separated by at least one minute . Keep a log of your BP readings . For a visual, please reference this diagram: http://ccnc.care/bpdiagram  Provider Name: [facility]     Phone: [phone]  Zone 1: ALL CLEAR  Continue to monitor your symptoms:  . BP reading is less than 140 (top number) or less than 90 (bottom number)  . No right upper stomach pain . No headaches or  seeing spots . No feeling nauseated or throwing up . No swelling in face and hands  Zone 2: CAUTION Call your doctor's office for any of the following:  . BP reading is greater than 140 (top number) or greater than 90 (bottom number)  . Stomach pain under your ribs in the middle or right side . Headaches or seeing spots . Feeling nauseated or throwing up . Swelling in face and hands  Zone 3: EMERGENCY  Seek immediate medical care if you have any of the following:  . BP reading is greater than160 (top number) or greater than 110 (bottom number) . Severe headaches not improving with Tylenol . Serious difficulty catching your breath . Any worsening symptoms from Zone 2    First Trimester of  Pregnancy The first trimester of pregnancy is from week 1 until the end of week 12 (months 1 through 3). A week after a sperm fertilizes an egg, the egg will implant on the wall of the uterus. This embryo will begin to develop into a baby. Genes from you and your partner are forming the baby. The female genes determine whether the baby is a boy or a girl. At 6-8 weeks, the eyes and face are formed, and the heartbeat can be seen on ultrasound. At the end of 12 weeks, all the baby's organs are formed.  Now that you are pregnant, you will want to do everything you can to have a healthy baby. Two of the most important things are to get good prenatal care and to follow your health care provider's instructions. Prenatal care is all the medical care you receive before the baby's birth. This care will help prevent, find, and treat any problems during the pregnancy and childbirth. BODY CHANGES Your body goes through many changes during pregnancy. The changes vary from woman to woman.   You may gain or lose a couple of pounds at first.  You may feel sick to your stomach (nauseous) and throw up (vomit). If the vomiting is uncontrollable, call your health care provider.  You may tire easily.  You may develop headaches that can be relieved by medicines approved by your health care provider.  You may urinate more often. Painful urination may mean you have a bladder infection.  You may develop heartburn as a result of your pregnancy.  You may develop constipation because certain hormones are causing the muscles that push waste through your intestines to slow down.  You may develop hemorrhoids or swollen, bulging veins (varicose veins).  Your breasts may begin to grow larger and become tender. Your nipples may stick out more, and the tissue that surrounds them (areola) may become darker.  Your gums may bleed and may be sensitive to brushing and flossing.  Dark spots or blotches (chloasma, mask of pregnancy)  may develop on your face. This will likely fade after the baby is born.  Your menstrual periods will stop.  You may have a loss of appetite.  You may develop cravings for certain kinds of food.  You may have changes in your emotions from day to day, such as being excited to be pregnant or being concerned that something may go wrong with the pregnancy and baby.  You may have more vivid and strange dreams.  You may have changes in your hair. These can include thickening of your hair, rapid growth, and changes in texture. Some women also have hair loss during or after pregnancy, or hair that feels dry or thin. Your  hair will most likely return to normal after your baby is born. WHAT TO EXPECT AT YOUR PRENATAL VISITS During a routine prenatal visit:  You will be weighed to make sure you and the baby are growing normally.  Your blood pressure will be taken.  Your abdomen will be measured to track your baby's growth.  The fetal heartbeat will be listened to starting around week 10 or 12 of your pregnancy.  Test results from any previous visits will be discussed. Your health care provider may ask you:  How you are feeling.  If you are feeling the baby move.  If you have had any abnormal symptoms, such as leaking fluid, bleeding, severe headaches, or abdominal cramping.  If you have any questions. Other tests that may be performed during your first trimester include:  Blood tests to find your blood type and to check for the presence of any previous infections. They will also be used to check for low iron levels (anemia) and Rh antibodies. Later in the pregnancy, blood tests for diabetes will be done along with other tests if problems develop.  Urine tests to check for infections, diabetes, or protein in the urine.  An ultrasound to confirm the proper growth and development of the baby.  An amniocentesis to check for possible genetic problems.  Fetal screens for spina bifida and  Down syndrome.  You may need other tests to make sure you and the baby are doing well. HOME CARE INSTRUCTIONS  Medicines  Follow your health care provider's instructions regarding medicine use. Specific medicines may be either safe or unsafe to take during pregnancy.  Take your prenatal vitamins as directed.  If you develop constipation, try taking a stool softener if your health care provider approves. Diet  Eat regular, well-balanced meals. Choose a variety of foods, such as meat or vegetable-based protein, fish, milk and low-fat dairy products, vegetables, fruits, and whole grain breads and cereals. Your health care provider will help you determine the amount of weight gain that is right for you.  Avoid raw meat and uncooked cheese. These carry germs that can cause birth defects in the baby.  Eating four or five small meals rather than three large meals a day may help relieve nausea and vomiting. If you start to feel nauseous, eating a few soda crackers can be helpful. Drinking liquids between meals instead of during meals also seems to help nausea and vomiting.  If you develop constipation, eat more high-fiber foods, such as fresh vegetables or fruit and whole grains. Drink enough fluids to keep your urine clear or pale yellow. Activity and Exercise  Exercise only as directed by your health care provider. Exercising will help you:  Control your weight.  Stay in shape.  Be prepared for labor and delivery.  Experiencing pain or cramping in the lower abdomen or low back is a good sign that you should stop exercising. Check with your health care provider before continuing normal exercises.  Try to avoid standing for long periods of time. Move your legs often if you must stand in one place for a long time.  Avoid heavy lifting.  Wear low-heeled shoes, and practice good posture.  You may continue to have sex unless your health care provider directs you otherwise. Relief of Pain  or Discomfort  Wear a good support bra for breast tenderness.    Take warm sitz baths to soothe any pain or discomfort caused by hemorrhoids. Use hemorrhoid cream if your health care provider  approves.    Rest with your legs elevated if you have leg cramps or low back pain.  If you develop varicose veins in your legs, wear support hose. Elevate your feet for 15 minutes, 3-4 times a day. Limit salt in your diet. Prenatal Care  Schedule your prenatal visits by the twelfth week of pregnancy. They are usually scheduled monthly at first, then more often in the last 2 months before delivery.  Write down your questions. Take them to your prenatal visits.  Keep all your prenatal visits as directed by your health care provider. Safety  Wear your seat belt at all times when driving.  Make a list of emergency phone numbers, including numbers for family, friends, the hospital, and police and fire departments. General Tips  Ask your health care provider for a referral to a local prenatal education class. Begin classes no later than at the beginning of month 6 of your pregnancy.  Ask for help if you have counseling or nutritional needs during pregnancy. Your health care provider can offer advice or refer you to specialists for help with various needs.  Do not use hot tubs, steam rooms, or saunas.  Do not douche or use tampons or scented sanitary pads.  Do not cross your legs for long periods of time.  Avoid cat litter boxes and soil used by cats. These carry germs that can cause birth defects in the baby and possibly loss of the fetus by miscarriage or stillbirth.  Avoid all smoking, herbs, alcohol, and medicines not prescribed by your health care provider. Chemicals in these affect the formation and growth of the baby.  Schedule a dentist appointment. At home, brush your teeth with a soft toothbrush and be gentle when you floss. SEEK MEDICAL CARE IF:   You have dizziness.  You have mild  pelvic cramps, pelvic pressure, or nagging pain in the abdominal area.  You have persistent nausea, vomiting, or diarrhea.  You have a bad smelling vaginal discharge.  You have pain with urination.  You notice increased swelling in your face, hands, legs, or ankles. SEEK IMMEDIATE MEDICAL CARE IF:   You have a fever.  You are leaking fluid from your vagina.  You have spotting or bleeding from your vagina.  You have severe abdominal cramping or pain.  You have rapid weight gain or loss.  You vomit blood or material that looks like coffee grounds.  You are exposed to [place] measles and have never had them.  You are exposed to fifth disease or chickenpox.  You develop a severe headache.  You have shortness of breath.  You have any kind of trauma, such as from a fall or a car accident. Document Released: 08/25/2001 Document Revised: 01/15/2014 Document Reviewed: 07/11/2013 [facility] Patient Information 2015 [place], [place]. This information is not intended to replace advice given to you by your health care provider. Make sure you discuss any questions you have with your health care provider.

## 2020-08-28 LAB — CYTOLOGY - PAP
Chlamydia: NEGATIVE
Comment: NEGATIVE
Comment: NORMAL
Diagnosis: NEGATIVE
Neisseria Gonorrhea: NEGATIVE

## 2020-08-28 LAB — URINE CULTURE

## 2020-09-02 ENCOUNTER — Other Ambulatory Visit: Payer: Self-pay | Admitting: Women's Health

## 2020-09-02 ENCOUNTER — Encounter: Payer: Self-pay | Admitting: Women's Health

## 2020-09-02 DIAGNOSIS — O09899 Supervision of other high risk pregnancies, unspecified trimester: Secondary | ICD-10-CM | POA: Insufficient documentation

## 2020-09-02 DIAGNOSIS — Z3401 Encounter for supervision of normal first pregnancy, first trimester: Secondary | ICD-10-CM

## 2020-09-02 DIAGNOSIS — R8271 Bacteriuria: Secondary | ICD-10-CM | POA: Insufficient documentation

## 2020-09-02 MED ORDER — AMOXICILLIN 500 MG PO CAPS: 500.0000 mg | ORAL_CAPSULE | Freq: Two times a day (BID) | ORAL | 0 refills | Status: DC

## 2020-09-03 ENCOUNTER — Telehealth: Payer: Self-pay | Admitting: *Deleted

## 2020-09-03 NOTE — Telephone Encounter (Signed)
Pt aware she has a UTI and med was sent to pharmacy. Pt voiced understanding. JSY 

## 2020-09-03 NOTE — Telephone Encounter (Signed)
-----   Message from Cheral Marker, [place] sent at 09/03/2020  8:42 AM EST ----- Hasn't read mychart message about UTI. Please call and read it to her/have her read it. Thanks

## 2020-09-03 NOTE — Telephone Encounter (Signed)
-----   Message from Kimberly R Booker, CNM sent at 09/03/2020  8:42 AM EST ----- °Hasn't read mychart message about UTI. Please call and read it to her/have her read it. Thanks  °

## 2020-09-09 LAB — CBC/D/PLT+RPR+RH+ABO+RUB AB...
Antibody Screen: NEGATIVE
Basophils Absolute: 0 10*3/uL (ref 0.0–0.2)
Basos: 0 %
EOS (ABSOLUTE): 0.1 10*3/uL (ref 0.0–0.4)
Eos: 1 %
HCV Ab: >11 s/co ratio — ABNORMAL HIGH (ref 0.0–0.9)
HIV Screen 4th Generation wRfx: NONREACTIVE
Hematocrit: 37.6 % (ref 34.0–46.6)
Hemoglobin: 13.5 g/dL (ref 11.1–15.9)
Hepatitis B Surface Ag: NEGATIVE
Immature Grans (Abs): 0 10*3/uL (ref 0.0–0.1)
Immature Granulocytes: 0 %
Lymphocytes Absolute: 1.7 10*3/uL (ref 0.7–3.1)
Lymphs: 17 %
MCH: 31.3 pg (ref 26.6–33.0)
MCHC: 35.9 g/dL — ABNORMAL HIGH (ref 31.5–35.7)
MCV: 87 fL (ref 79–97)
Monocytes Absolute: 0.8 10*3/uL (ref 0.1–0.9)
Monocytes: 8 %
Neutrophils Absolute: 7.1 10*3/uL — ABNORMAL HIGH (ref 1.4–7.0)
Neutrophils: 74 %
Platelets: 322 10*3/uL (ref 150–450)
RBC: 4.32 x10E6/uL (ref 3.77–5.28)
RDW: 11.9 % (ref 11.7–15.4)
RPR Ser Ql: NONREACTIVE
Rh Factor: POSITIVE
Rubella Antibodies, IGG: <0.9 index — ABNORMAL LOW (ref >0.99)
WBC: 9.7 10*3/uL (ref 3.4–10.8)

## 2020-09-09 LAB — INTEGRATED 1
Crown Rump Length: 55.4 mm
Gest. Age on Collection Date: 12 weeks
Maternal Age at EDD: 23.4 yr
Nuchal Translucency (NT): 3.7 mm
Number of Fetuses: 1
PAPP-A Value: 1545.8 ng/mL
Weight: 140 [lb_av]

## 2020-09-09 LAB — INTERPRETATION:

## 2020-09-09 LAB — HCV RNA NAA QUALITATIVE: HCV RNA NAA QUALITATIVE: POSITIVE — AB

## 2020-09-11 ENCOUNTER — Encounter: Payer: Self-pay | Admitting: *Deleted

## 2020-09-11 DIAGNOSIS — Z3402 Encounter for supervision of normal first pregnancy, second trimester: Secondary | ICD-10-CM

## 2020-09-14 NOTE — L&D Delivery Note (Addendum)
OB/GYN Faculty Practice Delivery Note  Amber Maynard is a 25 y.o. G1P0000 s/p SVD at [redacted]w[redacted]d. She was admitted for PPROM at 0900 on 6/2  ROM: rupture date, rupture time, delivery date, or delivery time have not been documented with clear fluid GBS Status: Positive/-- (12/13 0000) Maximum Maternal Temperature: 18 F  Labor Progress:  Patient presented to L&D for IOL due to PPROM. Initial SVE: 3/100/-1. Labor course was uncomplicated. She received pitocin at 1500 and was uptitrated. Epidural was placed. She then progressed to complete.   Delivery Date/Time: 02/14/21 0050 Delivery: During cervical check pt was found to be complete. Head position was OP and delivered with ease over the perineum. Loose nuchal cord was delivered through. Shoulder and body delivered in usual fashion. Infant with spontaneous cry, placed on mother's abdomen, dried and stimulated. Cord clamped x 2 after 1-minute delay, and cut by FOB. Cord blood drawn. Placenta delivered spontaneously with gentle cord traction. Fundus firm with massage and pitocin started. Labia, perineum, vagina, and cervix inspected and significant for 1st degree perineal lac and small R labial lac.  Baby Weight: pending  Cord: central insertion, 3 vessel Placenta: Sent to L&D Complications: None Lacerations: R labial lac and 1st degree perineal lac repaired in the standard fashion EBL: 300cc Analgesia: Epidural   Infant: APGAR (1 MIN):  8 APGAR (5 MINS): 9   Will Weisgerber MD, PGY-1 OBGYN Faculty Teaching Service  02/14/2021, 1:23 AM   The above was performed under my direct supervision and guidance.

## 2020-09-16 ENCOUNTER — Encounter: Payer: Medicaid Other | Admitting: Obstetrics & Gynecology

## 2020-09-20 ENCOUNTER — Ambulatory Visit (INDEPENDENT_AMBULATORY_CARE_PROVIDER_SITE_OTHER): Payer: Medicaid Other | Admitting: Medical

## 2020-09-20 ENCOUNTER — Other Ambulatory Visit: Payer: Self-pay

## 2020-09-20 VITALS — BP 120/80 | HR 98 | Wt 161.0 lb

## 2020-09-20 DIAGNOSIS — Z3A15 15 weeks gestation of pregnancy: Secondary | ICD-10-CM

## 2020-09-20 DIAGNOSIS — O09899 Supervision of other high risk pregnancies, unspecified trimester: Secondary | ICD-10-CM

## 2020-09-20 DIAGNOSIS — Z283 Underimmunization status: Secondary | ICD-10-CM

## 2020-09-20 DIAGNOSIS — Z1389 Encounter for screening for other disorder: Secondary | ICD-10-CM

## 2020-09-20 DIAGNOSIS — B182 Chronic viral hepatitis C: Secondary | ICD-10-CM

## 2020-09-20 DIAGNOSIS — O99891 Other specified diseases and conditions complicating pregnancy: Secondary | ICD-10-CM

## 2020-09-20 DIAGNOSIS — R8271 Bacteriuria: Secondary | ICD-10-CM

## 2020-09-20 DIAGNOSIS — Z3402 Encounter for supervision of normal first pregnancy, second trimester: Secondary | ICD-10-CM

## 2020-09-20 DIAGNOSIS — A599 Trichomoniasis, unspecified: Secondary | ICD-10-CM

## 2020-09-20 LAB — POCT URINALYSIS DIPSTICK OB
Blood, UA: NEGATIVE
Glucose, UA: NEGATIVE
Ketones, UA: NEGATIVE
Leukocytes, UA: NEGATIVE
Nitrite, UA: NEGATIVE
POC,PROTEIN,UA: NEGATIVE

## 2020-09-20 NOTE — Patient Instructions (Addendum)
Childbirth Education Options: [facility] Classes:  Childbirth education classes can help you get ready for a positive parenting experience. You can also meet other expectant parents and get free stuff for your baby. Each class runs for five weeks on the same night and costs $45 for the mother-to-be and her support person. Medicaid covers the cost if you are eligible. Call [phone] to register. [facility] Childbirth Education:  [phone] or [phone] or [email or web address]  Baby & Me Class: Discuss newborn & infant parenting and family adjustment issues with other new mothers in a relaxed environment. Each week brings a new speaker or baby-centered activity. We encourage new mothers to join us every Thursday at 11:00am. Babies birth until crawling. No registration or fee. Daddy Boot Camp: This course offers Dads-to-be the tools and knowledge needed to feel confident on their journey to becoming new fathers. Experienced dads, who have been trained as coaches, teach dads-to-be how to hold, comfort, diaper, swaddle and play with their infant while being able to support the new mom as well. A class for men taught by men. $25/dad Big Brother/Big Sister: Let your children share in the joy of a new brother or sister in this special class designed just for them. Class includes discussion about how families care for babies: swaddling, holding, diapering, safety as well as how they can be helpful in their new role. This class is designed for children ages 2 to 6, but any age is welcome. Please register each child individually. $5/child  Mom Talk: This mom-led group offers support and connection to mothers as they journey through the adjustments and struggles of that sometimes overwhelming first year after the birth of a child. Tuesdays at 10:00am and Thursdays at 6:00pm. Babies welcome. No registration or fee. Breastfeeding Support Group: This group is a mother-to-mother  support circle where moms have the opportunity to share their breastfeeding experiences. A Lactation Consultant is present for questions and concerns. Meets each Tuesday at 11:00am. No fee or registration. Breastfeeding Your Baby: Learn what to expect in the first days of breastfeeding your newborn.  This class will help you feel more confident with the skills needed to begin your breastfeeding experience. Many new mothers are concerned about breastfeeding after leaving the hospital. This class will also address the most common fears and challenges about breastfeeding during the first few weeks, months and beyond. (call for fee) Comfort Techniques and Tour: This 2 hour interactive class will provide you the opportunity to learn & practice hands-on techniques that can help relieve some of the discomfort of labor and encourage your baby to rotate toward the best position for birth. You and your partner will be able to try a variety of labor positions with birth balls and rebozos as well as practice breathing, relaxation, and visualization techniques. A tour of the [facility] is included with this class. $20 per registrant and support person Childbirth Class- Weekend Option: This class is a Weekend version of our Birth & Baby series. It is designed for parents who have a difficult time fitting several weeks of classes into their schedule. It covers the care of your newborn and the basics of labor and childbirth. It also includes a Maternity Care Center Tour of [facility] and lunch. The class is held two consecutive days: beginning on Friday evening from 6:30 - 8:30 p.m. and the next day, Saturday from 9 a.m. - 4 p.m. (call for fee) Waterbirth Class: Interested in a waterbirth?  This   informational class will help you discover whether waterbirth is the right fit for you. Education about waterbirth itself, supplies you would need and how to assemble your support team is what you can  expect from this class. Some obstetrical practices require this class in order to pursue a waterbirth. (Not all obstetrical practices offer waterbirth-check with your healthcare provider.) Register only the expectant mom, but you are encouraged to bring your partner to class! Required if planning waterbirth, no fee. Infant/Child CPR: Parents, grandparents, babysitters, and friends learn Cardio-Pulmonary Resuscitation skills for infants and children. You will also learn how to treat both conscious and unconscious choking in infants and children. This Family & Friends program does not offer certification. Register each participant individually to ensure that enough mannequins are available. (Call for fee) Grandparent Love: Expecting a grandbaby? This class is for you! Learn about the latest infant care and safety recommendations and ways to support your own child as he or she transitions into the parenting role. Taught by Registered Nurses who are childbirth instructors, but most importantly...they are grandmothers too! $10/person. Childbirth Class- Natural Childbirth: This series of 5 weekly classes is for expectant parents who want to learn and practice natural methods of coping with the process of labor and childbirth. Relaxation, breathing, massage, visualization, role of the partner, and helpful positioning are highlighted. Participants learn how to be confident in their body's ability to give birth. This class will empower and help parents make informed decisions about their own care. Includes discussion that will help new parents transition into the immediate postpartum period. Maternity Care Center Tour [facility] is included. We suggest taking this class between 25-32 weeks, but it's only a recommendation. $75 per registrant and one support person or $30 Medicaid. Childbirth Class- 3 week Series: This option of 3 weekly classes helps you and your labor partner prepare for childbirth. Newborn  care, labor & birth, cesarean birth, pain management, and comfort techniques are discussed and a Maternity Care Center Tour of [facility] is included. The class meets at the same time, on the same day of the week for 3 consecutive weeks beginning with the starting date you choose. $60 for registrant and one support person.  Marvelous Multiples: Expecting twins, triplets, or more? This class covers the differences in labor, birth, parenting, and breastfeeding issues that face multiples' parents. NICU tour is included. Led by a Certified Childbirth Educator who is the mother of twins. No fee. Caring for Baby: This class is for expectant and adoptive parents who want to learn and practice the most up-to-date newborn care for their babies. Focus is on birth through the first six weeks of life. Topics include feeding, bathing, diapering, crying, umbilical cord care, circumcision care and safe sleep. Parents learn to recognize symptoms of illness and when to call the pediatrician. Register only the mom-to-be and your partner or support person can plan to come with you! $10 per registrant and support person Childbirth Class- online option: This online class offers you the freedom to complete a Birth and Baby series in the comfort of your own home. The flexibility of this option allows you to review sections at your own pace, at times convenient to you and your support people. It includes additional video information, animations, quizzes, and extended activities. Get organized with helpful eClass tools, checklists, and trackers. Once you register online for the class, you will receive an email within a few days to accept the invitation and begin the class when the time   is right for you. The content will be available to you for 60 days. $60 for 60 days of online access for you and your support people.    AREA PEDIATRIC/FAMILY PRACTICE PHYSICIANS  [place]outheast [place] ([id number]) . [facility] Melodie Bouillon, MD; Lum Babe, MD; [place] Slider, MD; Leveda Anna, MD; McDiarmid, MD; Jerene Bears, MD; Jennette Kettle, MD; Gwendolyn Grant, MD o [address] [place] [place], [place] [address] o [phone] o Mon-Fri 8:30-12:30, 1:30-5:00 o Providers come to see babies at [facility] o Accepting Medicaid . [facility] o Limited providers who accept newborns: Docia Chuck, MD; Kateri Plummer, MD; Paulino Rily, MD o [address], [place], [place] [address] o [phone] o Mon-Fri 8:00-5:30 o Babies seen by providers at [facility] o Does NOT accept Medicaid o Please call early in hospitalization for appointment (limited availability)  . [facility] Fatima Sanger, MD o [address] [place], [place] [address] o [phone] o Mon, Tue, Thur, Fri 8:30-5:00, Wed 10:00-7:00 (closed 1-2pm) o Babies seen by [facility] providers o Accepting Medicaid . Donnie Coffin - Pediatrician Fae Pippin, MD o [address], [place], [place] [address] o [phone] o Mon-Fri 8:30-5:00, Sat 8:30-12:00 o Provider comes to see babies at [facility] o Accepting Medicaid o Must have been referred from current patients or contacted office prior to delivery . [facility]) Leotis Pain, MD; Ave Filter, MD; Luna Fuse, MD; Kennedy Bucker, MD; Konrad Dolores, MD; Kathlene November, MD; Jenne Campus, MD; Lubertha South, MD; Wynetta Emery, MD; Duffy Rhody, MD; Gerre Couch, NP; Shirl Harris, NP o [address] [place], [place], [place] [address] o [phone] o Mon, Tue, Thur, Fri 8:30-5:30, Wed 9:30-5:30, Sat 8:30-12:30 o Babies seen by [facility] providers o Accepting Medicaid o Only accepting infants of first-time parents or siblings of current patients [facility] discharge coordinator will make follow-up appointment . Cyril Mourning o [address] [place], [place]  [address] o [phone]   Fax - [phone] . [facility] o [address], [place], [place]  [address] o Phone - [phone]   Fax - [phone] . Lucio Edward o [address], [place], [place]  [address] o [phone]  [place] [phone]) . [facility] Jorge Mandril, MD; Alita Chyle, MD; Princella Ion, MD; MD; Earlene Plater, MD; Jamesetta Orleans, MD; Alvera Novel, MD; Clarene Duke, MD; Rana Snare, MD; Carmon Ginsberg, MD; Alinda Money, MD; Hosie Poisson, MD; Mayford Knife, MD o [address] [place], [place] [address] o [phone] o Mon-Fri 8:30-5:00 (extended evenings Mon-Thur as needed), Sat-Sun 10:00-1:00 o Providers come to see babies at [facility] o Accepting Medicaid for families of first-time babies and families with all children in the household age 73 and under. Must register with office prior to making appointment (M-F only). [facility] Odella Aquas, NP; Lynelle Doctor, MD; Susann Givens, MD; [place], [place] o [address], [place], [place] [address] o [phone] o Mon-Fri 8:00-5:00 o Babies seen by providers at [facility] o Does NOT accept Medicaid/Commercial Insurance Only . [facility] [place] ([facility])  Suzette Battiest, MD; Zachery Dauer, MD; Stefan Church, MD; Sabino Dick, MD; Quitman Livings, MD; Farris Has, MD; Gaynell Face, MD; Betha Loa, MD; Colon Flattery, MD; Clifton James, MD o [address] [place], [place] [address] o [phone] o Mon-Fri 8:30-5:30, Sat (Oct.-Mar.) 9:00-1:00 o Babies seen by providers at [facility] o Accepting [facility] [phone]) . [facility] Dimitri, MD; Sheliah [place], MD o [address] [place].  [place], [place] [address] o [phone] o Mon-Fri 8:30-5:00, Sat 8:30-12:00 o Providers come to see babies at [facility] o Does NOT accept Medicaid . [facility] o Becker, PA; Hagler, MD; Scifres, PA; Sun, MD; Swayne, MD o [address], [place], [place] [address] o [phone] o Mon-Fri 8:00-5:00 o Babies seen by providers at [facility] o Does  NOT accept Medicaid o Only accepting babies of parents who are patients o Please call early in hospitalization for appointment (limited availability) . [facility] o Clark, MD; Frye, MD; Kelleher, MD; Mack, NP; Miller, MD; O'Keller, MD; Patterson, NP; Pudlo, MD; Puzio, MD; Thomas, MD; Tucker, MD; Twiselton, MD o [address], [place], [place] [address] o [phone] o Mon-Fri 8:00-5:00, Sat 9:00-12:00 o Providers come to see babies at [facility] o Does NOT accept Medicaid  [place] ([id number]) . [facility] [employer] o Limited providers accepting new patients: Brake, NP; [place], PA o [address], [place], [place] [address] o [phone] o Mon-Fri 8:00-5:00 o Babies seen by providers at [facility] o Does NOT accept Medicaid o Only accepting babies of parents who are patients o Please call early in hospitalization for appointment (limited availability) . [facility] o Gay, MD; Quinlan, MD o [address] [place], [place] [address] o [phone] (press 1 to schedule appointment) o Mon-Fri 8:00-5:00 o Providers come to see babies at [facility] o Does NOT accept Medicaid . [facility] o Mazer, MD o [address] [place], [place] [address] o [phone] o Mon-Fri 8:30-5:00 (lunch 12:30-1:00), extended hours by appointment only Wed 5:00-6:30 o Babies seen by [facility] providers o Accepting Medicaid . [facility] o Banks, MD; Jordan, MD; Koberlein, MD o [address], [place], [place] [address] o [phone] o Mon-Fri 8:00-5:00 o Babies seen by [facility] providers o Does NOT accept Medicaid . [facility] [place] o Parker, MD; Hunter, MD; Wallace, DO o [address] [place], [place] [address] o [phone] o Mon-Fri 8:00-5:00 o Babies seen by [facility] providers o Does NOT accept Medicaid . [facility] o Brandon, PA; Brecken, PA; Christy, NP; Dees, MD; DeClaire, MD; DeWeese, MD; Hansen, NP; Mills, NP; Parrish, NP; Smoot, NP; Summer, MD; Vapne, MD o [address] [place], [place] [address] o [phone] o Mon-Fri 8:30-5:00, Sat 10:00-1:00 o Providers come to see babies at [facility] o Does NOT accept Medicaid o Free prenatal information session Tuesdays at 4:45pm . [facility] o Bouska, MD; Gordon, PA; Jeffery, PA; Weber, PA o [address] [place] [address] o [phone] o Mon-Fri 7:30-5:30 o Babies seen by [facility] providers . [facility] o [address], [place], [place]  [address] o [phone]   Fax - [phone]  [place] ([phone]) . [facility] o [place], [place] o [address] [place], [place] [address] o [phone] o Mon-Thur 8:00-6:00 o Providers come to see babies at [facility] o Accepting Medicaid . [facility] o Anderson, NP; Badger, MD; Beal, PA; Spencer, PA o [address] , [place] [address] o [phone] o Mon-Thur 7:30-7:30, Fri 7:30-4:30 o Babies seen by [facility] providers o Accepting Medicaid . [facility] o Agbuya, MD; Klett, NP; Romgoolam, MD o [address], , [place] [address] o [phone] o Mon-Fri 8:30-5:00, Sat 8:30-12:00 o Providers come to see babies at   babies at [facility] o Accepting Medicaid o Must have "Meet & Greet" appointment at office prior to delivery . [facility] [place] [facility]) Llana Aliment, MD; Earlene Plater, MD; Lucretia Roers, MD o [address], [place], [place] [address] o [phone] o Mon-Wed 8:00-6:00, Thur-Fri 8:00-5:00, Sat 9:00-12:00 o Providers come to see babies at [facility] o Does NOT accept Medicaid o Only accepting siblings of current  patients . [facility]  o [address], [place], [place]  [address] o [phone]   Fax - [phone] . [facility] at [facility] o [phone] [address] [place], [place]  [address] o [phone]   Fax - [phone]  [place] [phone] & [phone]) . Nature conservation officer at [facility] o Lamoni, DO; [place] Pri[place], DO o [address] [place], [place] [address] o [phone] o Mon-Fri 7:00-5:00 o Babies seen by [facility] providers o Does NOT accept Medicaid . [facility] Ellis Savage, MD; [place], [place]; [place], [place] o [address], [place], [place] [address] o [phone] o Mon-Fri 8:00-5:00 o Babies seen by [facility] providers o Accepting Medicaid . [facility] - [address] Franne Forts, MD; [place], [place]; Hagan, NP; [place], [place] o [address] [place], [place], [place] [address] o [phone] o Mon-Fri 8:00-5:00 o Babies seen by providers at [facility] o Accepting [facility] [place] [phone]) . [facility] [place], [place] o [address] [place], [place] [address] o [phone] o Mon-Fri 8:00-5:00 o Babies seen by [facility] providers o Does NOT accept Medicaid o Limited availability, please call early in hospitalization to schedule follow-up . [facility] Jolee Ewing, PA; Eddie Candle, MD; [place], MD; [place], [place]; Constance Goltz, MD; [place], [place] o [facility] [facility] [address], [place], [place] [address] o [phone] o Mon-Fri 8:30-5:00, Sat 9:00-12:00 o Babies seen by providers at [facility] o Accepting Medicaid o Please register online then schedule online or call office o [email or web address] . [facility]) Samuella Bruin, NP; Lucianne Muss, MD; Lanier Clam, PA o [address], [place], [place]  [address] o [phone] o Mon-Fri 8:00-5:00 o Babies seen by providers at [facility] o Accepting Medicaid . [facility] ([facility]) Sharin Mons, MD; Reed Breech, NP; Shelva Majestic, MD o [address], [place], [place] [address] o [phone] o Mon-Fri 8:00-5:30, Sat&Sun by appointment (phones open at 8:30) o Babies seen by [facility] providers o Accepting Medicaid o Must be a first-time baby or sibling of current patient . [facility] [address], [place], [place]  [address] o [phone]   Fax - [phone]  [place] [phone] & [phone]) . [facility] o [place], [place]; [place], [place]; Dimple Casey, MD; [place], [place]; Carolyne Fiscal, MD o [address], [place], [place] [address] o [phone] o Mon-Thur 8:00-7:00, Fri 8:00-5:00, Sat 8:00-12:00, Sun 9:00-12:00 o Babies seen by [facility] providers o Accepting Medicaid . [facility] at [facility], MD; Gaynell Face, MD; [facility], MD o [address], [place], [place] [address] o [phone] o Mon-Thur 8:00-5:00 o Babies seen by providers at [facility] o Accepting Medicaid . [facility] [place] Gwenlyn Saran, MD; Coe-Goins,  MD; Amedeo Plenty, MD; Bobby Rumpf, MD; List, MD; Lavonia Drafts, MD; Ruthann Cancer, MD; Selinda Eon, MD; Audie Box, MD; Jim Like, MD; Christie Nottingham, MD; [place] Hartshorn, MD; Modena Nunnery, MD o [address] [place], [place] [address] o [phone] o Mon-Fri 8:00-5:30, Sat (Oct.-Mar.) 9:00-1:00 o Babies seen by providers at [facility] o Accepting Medicaid o Must fill out new patient packet, available online at [email or web address] . [place] [facility]) Barnabas Lister, NP; [place] Kingfisher, NP; Claiborne Billings, NP; Rolla Plate, MD; [place], [place]; Carola Rhine, MD; Tyron Russell, MD; Delia Chimes, NP o [address], [place], [place]  [address] o [phone] o Mon-Thur 8:00-5:30, Fri 8:00-5:00 o Babies seen by providers at [place] [phone]) . [place], [place]; Ahtanum, MD; Dennard Schaumann, MD; [place], [place] o [address] [place], [place] [address] o [phone] o Mon-Fri 8:00-5:00 o Babies seen by providers at [place] [phone]) . [place] at [place], [place]; Olen Pel, MD; [place], [place], [place], [place] [address] o [phone] o Mon-Fri 8:00-5:00 o Babies seen by providers at [facility] o Does NOT accept Medicaid o Limited appointment availability, please call early in hospitalization  . Therapist, music at [place], [place]; [place], [place] [address], [place], [place] [address] o [phone] o Mon-Fri 8:00-5:00 o Babies seen by [facility] providers o Does NOT accept Medicaid . [facility] Su [place], MD; Guy Sandifer, MD; [place], [place]; [place], [place] [address], [place], [place] [address] o [phone] o Mon-Fri 8:00-5:00 o After hours clinic [facility] [place], [place] [address]) [phone] Mon-Fri 5:00-8:00, Sat 12:00-6:00, Sun 10:00-4:00 o Babies seen by [facility] providers o Accepting Medicaid . [place] at [facility] o [address] [place], [place]  [address] o [phone]   Fax - [phone]  [place] [phone]) . Therapist, music at [facility], MD o [address] [place], [place],  [address] o [phone] o Mon-Fri 8:00-5:00 o Babies seen by [facility] providers o Does NOT accept Medicaid . [place] ([place] [facility] [place]) Bing Neighbors, MD o [address] [place], [place],  [address] o [phone] o Mon-Thur 8:00-7:00, Fri 8:00-5:00, Sat 8:00-12:00 o Babies seen by providers at  [facility] o Accepting Medicaid - but does not have vaccinations in office (must be received elsewhere) o Limited availability, please call early in hospitalization  [place] ([id number]) . [place], [place] [address] [place] [address] o [phone]  Fax [phone]

## 2020-09-20 NOTE — Progress Notes (Signed)
   PRENATAL VISIT NOTE  Subjective:  Amber Maynard is a 25 y.o. G1P0000 at [redacted]w[redacted]d being seen today for ongoing prenatal care.  She is currently monitored for the following issues for this high-risk pregnancy and has Respiratory failure ([place]); Acute respiratory failure with hypoxia ([place]); Aspiration pneumonitis ([place]); Smoker; Acute encephalopathy; Accidental drug overdose; Trichomonas infection; IV drug user; Supervision of normal first pregnancy; Hepatitis C; Rubella non-immune status, antepartum; and GBS bacteriuria on their problem list.  Patient reports no complaints.  Contractions: Not present. Vag. Bleeding: None.  Movement: Absent. Denies leaking of fluid.   The following portions of the patient's history were reviewed and updated as appropriate: allergies, current medications, past family history, past medical history, past social history, past surgical history and problem list.   Objective:   Vitals:   09/20/20 1035  BP: 120/80  Pulse: 98  Weight: 161 lb (73 kg)    Fetal Status: Fetal Heart Rate (bpm): 148   Movement: Absent     General:  Alert, oriented and cooperative. Patient is in no acute distress.  Skin: Skin is warm and dry. No rash noted.   Cardiovascular: Normal heart rate noted  Respiratory: Normal respiratory effort, no problems with respiration noted  Abdomen: Soft, gravid, appropriate for gestational age.  Pain/Pressure: Absent     Pelvic: Cervical exam deferred        Extremities: Normal range of motion.  Edema: None  Mental Status: Normal mood and affect. Normal behavior. Normal judgment and thought content.   Assessment and Plan:  Pregnancy: G1P0000 at [redacted]w[redacted]d 1. Encounter for supervision of normal first pregnancy in second trimester - INTEGRATED 2 - Pain Management Screening Profile (10S) - Trichomonas vaginalis, RNA - Patient currently incarcerated - Declined flu vaccine - Planning to breast feed - Declines birth control   - Peds list given   2.  GBS bacteriuria - Had not taken Amoxicillin - new Rx given written  - Urine Culture  3. [redacted] weeks gestation of pregnancy - INTEGRATED 2 - Pain Management Screening Profile (10S) - Trichomonas vaginalis, RNA  4. Screening for genitourinary condition - POC Urinalysis Dipstick OB  5. Trichomonas infection - TOC today - Patient states was sexually active with same partner after treatment and unsure of partner treatment   6. Chronic hepatitis C without hepatic coma (HCC)  7. Rubella non-immune status, antepartum - MMR PP planned  Preterm labor/second symptoms and general obstetric precautions including but not limited to vaginal bleeding, contractions, leaking of fluid and fetal movement were reviewed in detail with the patient. Please refer to After Visit Summary for other counseling recommendations.   Return in about 4 weeks (around 10/18/2020) for LOB, In-Person.  No future appointments.  Kerry Hough, PA-C

## 2020-09-21 LAB — MED LIST OPTION NOT SELECTED

## 2020-09-22 LAB — TRICHOMONAS VAGINALIS, PROBE AMP: Trich vag by NAA: NEGATIVE

## 2020-09-22 LAB — URINE CULTURE

## 2020-09-23 LAB — PMP SCREEN PROFILE (10S), URINE
Amphetamine Scrn, Ur: NEGATIVE ng/mL
BARBITURATE SCREEN URINE: NEGATIVE ng/mL
BENZODIAZEPINE SCREEN, URINE: NEGATIVE ng/mL
CANNABINOIDS UR QL SCN: NEGATIVE ng/mL
Cocaine (Metab) Scrn, Ur: NEGATIVE ng/mL
Creatinine(Crt), U: 28.5 mg/dL (ref 20.0–300.0)
Methadone Screen, Urine: NEGATIVE ng/mL
OXYCODONE+OXYMORPHONE UR QL SCN: NEGATIVE ng/mL
Opiate Scrn, Ur: NEGATIVE ng/mL
Ph of Urine: 7.9 (ref 4.5–8.9)
Phencyclidine Qn, Ur: NEGATIVE ng/mL
Propoxyphene Scrn, Ur: NEGATIVE ng/mL

## 2020-09-24 LAB — INTEGRATED 2
AFP MoM: 0.9
Alpha-Fetoprotein: 28.2 ng/mL
Crown Rump Length: 55.4 mm
DIA MoM: 0.87
DIA Value: 149.1 pg/mL
Estriol, Unconjugated: 0.84 ng/mL
Gest. Age on Collection Date: 12 weeks
Gestational Age: 15.6 weeks
Maternal Age at EDD: 23.4 yr
Nuchal Translucency (NT): 3.7 mm
Nuchal Translucency MoM: 2.99
Number of Fetuses: 1
PAPP-A MoM: 1.73
PAPP-A Value: 1545.8 ng/mL
Test Results:: NEGATIVE
Weight: 140 [lb_av]
Weight: 140 [lb_av]
hCG MoM: 0.88
hCG Value: 37.9 IU/mL
uE3 MoM: 0.97

## 2020-10-21 ENCOUNTER — Other Ambulatory Visit: Payer: Self-pay | Admitting: Medical

## 2020-10-21 DIAGNOSIS — Z363 Encounter for antenatal screening for malformations: Secondary | ICD-10-CM

## 2020-10-22 ENCOUNTER — Encounter: Payer: Medicaid Other | Admitting: Women's Health

## 2020-10-22 ENCOUNTER — Other Ambulatory Visit: Payer: Medicaid Other

## 2020-11-22 ENCOUNTER — Telehealth: Payer: Self-pay | Admitting: Obstetrics & Gynecology

## 2020-11-22 NOTE — Telephone Encounter (Signed)
Faxed completed.  

## 2020-11-22 NOTE — Telephone Encounter (Signed)
Lanora Manis with [facility] called to request we fax the patient's 2nd genetic test lab results  Please advise   Ph# [phone], Fx# [phone]

## 2021-01-20 ENCOUNTER — Other Ambulatory Visit: Payer: Self-pay

## 2021-01-20 ENCOUNTER — Encounter: Payer: Self-pay | Admitting: Women's Health

## 2021-01-20 ENCOUNTER — Ambulatory Visit (INDEPENDENT_AMBULATORY_CARE_PROVIDER_SITE_OTHER): Payer: Medicaid Other | Admitting: Women's Health

## 2021-01-20 VITALS — BP 133/83 | HR 84 | Wt 192.0 lb

## 2021-01-20 DIAGNOSIS — Z3A33 33 weeks gestation of pregnancy: Secondary | ICD-10-CM

## 2021-01-20 DIAGNOSIS — Z3403 Encounter for supervision of normal first pregnancy, third trimester: Secondary | ICD-10-CM

## 2021-01-20 NOTE — Patient Instructions (Signed)
Sharhonda A Steinhart, I greatly value your feedback.  If you receive a survey following your visit with [place] today, we appreciate you taking the time to fill it out.  Thanks, Joellyn Haff, CNM, WHNP-BC  [facility] ([address] [place], [place] [address]) Entrance C, located off of [address] valet parking   Go to [email or web address] to register for FREE online childbirth classes    Call the office [phone]) or go to [facility] if:  You begin to have strong, frequent contractions  Your water breaks.  Sometimes it is a big gush of fluid, sometimes it is just a trickle that keeps getting your panties wet or running down your legs  You have vaginal bleeding.  It is normal to have a small amount of spotting if your cervix was checked.   You don't feel your baby moving like normal.  If you don't, get you something to eat and drink and lay down and focus on feeling your baby move.  You should feel at least 10 movements in 2 hours.  If you don't, you should call the office or go to [facility].   Call the office [phone]) or go to [facility] hospital for these signs of pre-eclampsia:  Severe headache that does not go away with Tylenol  Visual changes- seeing spots, double, blurred vision  Pain under your right breast or upper abdomen that does not go away with Tums or heartburn medicine  Nausea and/or vomiting  Severe swelling in your hands, feet, and face    Home Blood Pressure Monitoring for Patients   Your provider has recommended that you check your blood pressure (BP) at least once a week at home. If you do not have a blood pressure cuff at home, one will be provided for you. Contact your provider if you have not received your monitor within 1 week.   Helpful Tips for Accurate Home Blood Pressure Checks  . Don't smoke, exercise, or drink caffeine 30 minutes before checking your BP . Use the restroom before checking your BP (a full  bladder can raise your pressure) . Relax in a comfortable upright chair . Feet on the ground . Left arm resting comfortably on a flat surface at the level of your heart . Legs uncrossed . Back supported . Sit quietly and don't talk . Place the cuff on your bare arm . Adjust snuggly, so that only two fingertips can fit between your skin and the top of the cuff . Check 2 readings separated by at least one minute . Keep a log of your BP readings . For a visual, please reference this diagram: http://ccnc.care/bpdiagram  Provider Name: [facility]     Phone: [phone]  Zone 1: ALL CLEAR  Continue to monitor your symptoms:  . BP reading is less than 140 (top number) or less than 90 (bottom number)  . No right upper stomach pain . No headaches or seeing spots . No feeling nauseated or throwing up . No swelling in face and hands  Zone 2: CAUTION Call your doctor's office for any of the following:  . BP reading is greater than 140 (top number) or greater than 90 (bottom number)  . Stomach pain under your ribs in the middle or right side . Headaches or seeing spots . Feeling nauseated or throwing up . Swelling in face and hands  Zone 3: EMERGENCY  Seek immediate medical care if you have any of  the following:  . BP reading is greater than160 (top number) or greater than 110 (bottom number) . Severe headaches not improving with Tylenol . Serious difficulty catching your breath . Any worsening symptoms from Zone 2  Preterm Labor and Birth Information  The normal length of a pregnancy is 39-41 weeks. Preterm labor is when labor starts before 37 completed weeks of pregnancy. What are the risk factors for preterm labor? Preterm labor is more likely to occur in women who:  Have certain infections during pregnancy such as a bladder infection, sexually transmitted infection, or infection inside the uterus (chorioamnionitis).  Have a shorter-than-normal cervix.  Have gone into  preterm labor before.  Have had surgery on their cervix.  Are younger than age 54 or older than age 25.  Are African American.  Are pregnant with twins or multiple babies (multiple gestation).  Take street drugs or smoke while pregnant.  Do not gain enough weight while pregnant.  Became pregnant shortly after having been pregnant. What are the symptoms of preterm labor? Symptoms of preterm labor include:  Cramps similar to those that can happen during a menstrual period. The cramps may happen with diarrhea.  Pain in the abdomen or lower back.  Regular uterine contractions that may feel like tightening of the abdomen.  A feeling of increased pressure in the pelvis.  Increased watery or bloody mucus discharge from the vagina.  Water breaking (ruptured amniotic sac). Why is it important to recognize signs of preterm labor? It is important to recognize signs of preterm labor because babies who are born prematurely may not be fully developed. This can put them at an increased risk for:  Long-term (chronic) heart and lung problems.  Difficulty immediately after birth with regulating body systems, including blood sugar, body temperature, heart rate, and breathing rate.  Bleeding in the brain.  Cerebral palsy.  Learning difficulties.  Death. These risks are highest for babies who are born before 48 weeks of pregnancy. How is preterm labor treated? Treatment depends on the length of your pregnancy, your condition, and the health of your baby. It may involve: 1. Having a stitch (suture) placed in your cervix to prevent your cervix from opening too early (cerclage). 2. Taking or being given medicines, such as: ? Hormone medicines. These may be given early in pregnancy to help support the pregnancy. ? Medicine to stop contractions. ? Medicines to help mature the baby's lungs. These may be prescribed if the risk of delivery is high. ? Medicines to prevent your baby from  developing cerebral palsy. If the labor happens before 34 weeks of pregnancy, you may need to stay in the hospital. What should I do if I think I am in preterm labor? If you think that you are going into preterm labor, call your health care provider right away. How can I prevent preterm labor in future pregnancies? To increase your chance of having a full-term pregnancy:  Do not use any tobacco products, such as cigarettes, chewing tobacco, and e-cigarettes. If you need help quitting, ask your health care provider.  Do not use street drugs or medicines that have not been prescribed to you during your pregnancy.  Talk with your health care provider before taking any herbal supplements, even if you have been taking them regularly.  Make sure you gain a healthy amount of weight during your pregnancy.  Watch for infection. If you think that you might have an infection, get it checked right away.  Make sure to  tell your health care provider if you have gone into preterm labor before. This information is not intended to replace advice given to you by your health care provider. Make sure you discuss any questions you have with your health care provider. Document Revised: 12/23/2018 Document Reviewed: 01/22/2016 Elsevier Patient Education  [place].

## 2021-01-20 NOTE — Progress Notes (Signed)
   LOW-RISK PREGNANCY VISIT Patient name: Amber Maynard MRN [id number]  Date of birth: 03/31/1996 Chief Complaint:   Routine Prenatal Visit  History of Present Illness:   Amber Maynard is a 25 y.o. G70P0000 female at [redacted]w[redacted]d with an Estimated Date of Delivery: 03/10/21 being seen today for ongoing management of a low-risk pregnancy. 1st visit back w/ [place] since being release from incarceration in April. Depression screen PHQ 2/9 08/26/2020  Decreased Interest 1  Down, Depressed, Hopeless 1  PHQ - 2 Score 2  Altered sleeping 1  Tired, decreased energy 1  Change in appetite 2  Feeling bad or failure about yourself  1  Trouble concentrating 0  Moving slowly or fidgety/restless 0  Suicidal thoughts 0  PHQ-9 Score 7    Today she reports no complaints. Denies any drug use since being released. States she was on suboxone briefly while incarcerated, but came off and does not feel she needs to restart. Contractions: Not present. Vag. Bleeding: None.  Movement: Present. denies leaking of fluid. Review of Systems:   Pertinent items are noted in HPI Denies abnormal vaginal discharge w/ itching/odor/irritation, headaches, visual changes, shortness of breath, chest pain, abdominal pain, severe nausea/vomiting, or problems with urination or bowel movements unless otherwise stated above. Pertinent History Reviewed:  Reviewed past medical,surgical, social, obstetrical and family history.  Reviewed problem list, medications and allergies. Physical Assessment:   Vitals:   01/20/21 1123  BP: 133/83  Pulse: 84  Weight: 192 lb (87.1 kg)  Body mass index is 36.28 kg/m.        Physical Examination:   General appearance: Well appearing, and in no distress  Mental status: Alert, oriented to person, place, and time  Skin: Warm & dry  Cardiovascular: Normal heart rate noted  Respiratory: Normal respiratory effort, no distress  Abdomen: Soft, gravid, nontender  Pelvic: Cervical exam deferred          Extremities: Edema: None  Fetal Status: Fetal Heart Rate (bpm): 140 Fundal Height: 33 cm Movement: Present    Chaperone: N/A   No results found for this or any previous visit (from the past 24 hour(s)).  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 Does not feel she needs to restart  4) HepC> plan tx pp  5) Thickened NT earlier in pregnancy> normal anatomy u/s, normal fetal echo   Meds: No orders of the defined types were placed in this encounter.  Labs/procedures today: none  Plan:  Continue routine obstetrical care  Next visit: prefers in person    Reviewed: Preterm labor symptoms and general obstetric precautions including but not limited to vaginal bleeding, contractions, leaking of fluid and fetal movement were reviewed in detail with the patient.  All questions were answered. Does have home bp cuff. Office bp cuff given: not applicable. Check bp weekly, let us know if consistently >140 and/or >90.  Follow-up: Return in about 2 weeks (around 02/03/2021) for LROB, CNM, in person.  Future Appointments  Date Time Provider Department Center  02/06/2021 10:10 AM Cresenzo-Dishmon, Scarlette Calico, CNM [facility]    No orders of the defined types were placed in this encounter.  Cheral Marker CNM, [facility] 01/20/2021 1:46 PM

## 2021-02-06 ENCOUNTER — Encounter: Payer: Self-pay | Admitting: Advanced Practice Midwife

## 2021-02-06 ENCOUNTER — Other Ambulatory Visit: Payer: Self-pay

## 2021-02-06 ENCOUNTER — Ambulatory Visit (INDEPENDENT_AMBULATORY_CARE_PROVIDER_SITE_OTHER): Payer: Medicaid Other | Admitting: Advanced Practice Midwife

## 2021-02-06 VITALS — BP 115/73 | HR 91 | Wt 186.0 lb

## 2021-02-06 DIAGNOSIS — Z3A35 35 weeks gestation of pregnancy: Secondary | ICD-10-CM

## 2021-02-06 DIAGNOSIS — Z3403 Encounter for supervision of normal first pregnancy, third trimester: Secondary | ICD-10-CM

## 2021-02-06 DIAGNOSIS — B182 Chronic viral hepatitis C: Secondary | ICD-10-CM

## 2021-02-06 NOTE — Patient Instructions (Signed)
Tarra A Kluesner, I greatly value your feedback.  If you receive a survey following your visit with [place] today, we appreciate you taking the time to fill it out.  Thanks, Cathie Beams, DNP, CNM  [facility] HAS MOVED!!! It is now [facility] ([address] [place], [place] [address]) Entrance located off of [address] Free 24/7 valet parking   Go to [email or web address] to register for FREE online childbirth classes    Call the office [phone]) or go to [facility] if:  You begin to have strong, frequent contractions  Your water breaks.  Sometimes it is a big gush of fluid, sometimes it is just a trickle that keeps getting your panties wet or running down your legs  You have vaginal bleeding.  It is normal to have a small amount of spotting if your cervix was checked.   You don't feel your baby moving like normal.  If you don't, get you something to eat and drink and lay down and focus on feeling your baby move.  You should feel at least 10 movements in 2 hours.  If you don't, you should call the office or go to [facility].   Home Blood Pressure Monitoring for Patients   Your provider has recommended that you check your blood pressure (BP) at least once a week at home. If you do not have a blood pressure cuff at home, one will be provided for you. Contact your provider if you have not received your monitor within 1 week.   Helpful Tips for Accurate Home Blood Pressure Checks  . Don't smoke, exercise, or drink caffeine 30 minutes before checking your BP . Use the restroom before checking your BP (a full bladder can raise your pressure) . Relax in a comfortable upright chair . Feet on the ground . Left arm resting comfortably on a flat surface at the level of your heart . Legs uncrossed . Back supported . Sit quietly and don't talk . Place the cuff on your bare arm . Adjust snuggly, so that only two fingertips can fit  between your skin and the top of the cuff . Check 2 readings separated by at least one minute . Keep a log of your BP readings . For a visual, please reference this diagram: http://ccnc.care/bpdiagram  Provider Name: [facility]     Phone: [phone]  Zone 1: ALL CLEAR  Continue to monitor your symptoms:  . BP reading is less than 140 (top number) or less than 90 (bottom number)  . No right upper stomach pain . No headaches or seeing spots . No feeling nauseated or throwing up . No swelling in face and hands  Zone 2: CAUTION Call your doctor's office for any of the following:  . BP reading is greater than 140 (top number) or greater than 90 (bottom number)  . Stomach pain under your ribs in the middle or right side . Headaches or seeing spots . Feeling nauseated or throwing up . Swelling in face and hands  Zone 3: EMERGENCY  Seek immediate medical care if you have any of the following:  . BP reading is greater than160 (top number) or greater than 110 (bottom number) . Severe headaches not improving with Tylenol . Serious difficulty catching your breath . Any worsening symptoms from Zone 2

## 2021-02-06 NOTE — Progress Notes (Addendum)
   LOW-RISK PREGNANCY VISIT Patient name: Amber Maynard MRN [id number]  Date of birth: 09/30/1995 Chief Complaint:   Routine Prenatal Visit  History of Present Illness:   Amber Maynard is a 25 y.o. G72P0000 female at [redacted]w[redacted]d with an Estimated Date of Delivery: 03/10/21 being seen today for ongoing management of a low-risk pregnancy.  Today she reports no complaints. Contractions: Irritability. Vag. Bleeding: None.  Movement: Present. denies leaking of fluid. Review of Systems:   Pertinent items are noted in HPI Denies abnormal vaginal discharge w/ itching/odor/irritation, headaches, visual changes, shortness of breath, chest pain, abdominal pain, severe nausea/vomiting, or problems with urination or bowel movements unless otherwise stated above. Pertinent History Reviewed:  Reviewed past medical,surgical, social, obstetrical and family history.  Reviewed problem list, medications and allergies. Physical Assessment:   Vitals:   02/06/21 1033  BP: 115/73  Pulse: 91  Weight: 186 lb (84.4 kg)  Body mass index is 35.14 kg/m.        Physical Examination:   General appearance: Well appearing, and in no distress  Mental status: Alert, oriented to person, place, and time  Skin: Warm & dry  Cardiovascular: Normal heart rate noted  Respiratory: Normal respiratory effort, no distress  Abdomen: Soft, gravid, nontender  Pelvic: Cervical exam deferred         Extremities: Edema: Trace  Fetal Status: Fetal Heart Rate (bpm): 131 Fundal Height: 33 cm Movement: Present    Chaperone: n/a    No results found for this or any previous visit (from the past 24 hour(s)).  Assessment & Plan:  1) Low-risk pregnancy G1P0000 at [redacted]w[redacted]d with an Estimated Date of Delivery: 03/10/21   2) Hep C:  Check RNA quant   Meds: No orders of the defined types were placed in this encounter.  Labs/procedures today: none  Plan:  Continue routine obstetrical care  Next visit: prefers in person    Reviewed:  Preterm labor symptoms and general obstetric precautions including but not limited to vaginal bleeding, contractions, leaking of fluid and fetal movement were reviewed in detail with the patient.  All questions were answered. Has home bp cuff. Check bp weekly, let us know if >140/90.   Follow-up: Return in about 1 week (around 02/13/2021) for LROB.  No orders of the defined types were placed in this encounter.  Jacklyn Shell DNP, CNM 02/06/2021 11:01 AM

## 2021-02-07 LAB — HCV RNA QUANT
HCV log10: 6.167 log10 IU/mL
Hepatitis C Quantitation: 1470000 IU/mL

## 2021-02-13 ENCOUNTER — Ambulatory Visit (INDEPENDENT_AMBULATORY_CARE_PROVIDER_SITE_OTHER): Payer: Medicaid Other | Admitting: Obstetrics & Gynecology

## 2021-02-13 ENCOUNTER — Inpatient Hospital Stay (HOSPITAL_COMMUNITY): Payer: Medicaid Other | Admitting: Anesthesiology

## 2021-02-13 ENCOUNTER — Encounter (HOSPITAL_COMMUNITY): Payer: Self-pay | Admitting: Obstetrics & Gynecology

## 2021-02-13 ENCOUNTER — Other Ambulatory Visit: Payer: Self-pay

## 2021-02-13 ENCOUNTER — Inpatient Hospital Stay (HOSPITAL_COMMUNITY)
Admission: AD | Admit: 2021-02-13 | Discharge: 2021-02-16 | DRG: 806 | Disposition: A | Discharge status: Home / Self Care | Payer: Medicaid Other | Attending: Obstetrics & Gynecology | Admitting: Obstetrics & Gynecology

## 2021-02-13 ENCOUNTER — Encounter: Payer: Self-pay | Admitting: Obstetrics & Gynecology

## 2021-02-13 VITALS — BP 118/76 | HR 101 | Wt 189.0 lb

## 2021-02-13 DIAGNOSIS — O99824 Streptococcus B carrier state complicating childbirth: Secondary | ICD-10-CM | POA: Diagnosis present

## 2021-02-13 DIAGNOSIS — B1920 Unspecified viral hepatitis C without hepatic coma: Secondary | ICD-10-CM | POA: Diagnosis present

## 2021-02-13 DIAGNOSIS — O42913 Preterm premature rupture of membranes, unspecified as to length of time between rupture and onset of labor, third trimester: Principal | ICD-10-CM | POA: Diagnosis present

## 2021-02-13 DIAGNOSIS — Z20822 Contact with and (suspected) exposure to covid-19: Secondary | ICD-10-CM | POA: Diagnosis present

## 2021-02-13 DIAGNOSIS — F1120 Opioid dependence, uncomplicated: Secondary | ICD-10-CM | POA: Diagnosis present

## 2021-02-13 DIAGNOSIS — O4290 Premature rupture of membranes, unspecified as to length of time between rupture and onset of labor, unspecified weeks of gestation: Secondary | ICD-10-CM

## 2021-02-13 DIAGNOSIS — O98413 Viral hepatitis complicating pregnancy, third trimester: Secondary | ICD-10-CM | POA: Diagnosis not present

## 2021-02-13 DIAGNOSIS — O09899 Supervision of other high risk pregnancies, unspecified trimester: Secondary | ICD-10-CM

## 2021-02-13 DIAGNOSIS — O0990 Supervision of high risk pregnancy, unspecified, unspecified trimester: Secondary | ICD-10-CM

## 2021-02-13 DIAGNOSIS — Z23 Encounter for immunization: Secondary | ICD-10-CM | POA: Diagnosis not present

## 2021-02-13 DIAGNOSIS — Z3403 Encounter for supervision of normal first pregnancy, third trimester: Secondary | ICD-10-CM

## 2021-02-13 DIAGNOSIS — Z3A36 36 weeks gestation of pregnancy: Secondary | ICD-10-CM | POA: Diagnosis not present

## 2021-02-13 DIAGNOSIS — F17210 Nicotine dependence, cigarettes, uncomplicated: Secondary | ICD-10-CM | POA: Diagnosis present

## 2021-02-13 DIAGNOSIS — R8271 Bacteriuria: Secondary | ICD-10-CM

## 2021-02-13 DIAGNOSIS — O99324 Drug use complicating childbirth: Secondary | ICD-10-CM | POA: Diagnosis present

## 2021-02-13 DIAGNOSIS — O99334 Smoking (tobacco) complicating childbirth: Secondary | ICD-10-CM | POA: Diagnosis present

## 2021-02-13 DIAGNOSIS — O9842 Viral hepatitis complicating childbirth: Secondary | ICD-10-CM | POA: Diagnosis present

## 2021-02-13 DIAGNOSIS — Z2839 Other underimmunization status: Secondary | ICD-10-CM

## 2021-02-13 LAB — RAPID URINE DRUG SCREEN, HOSP PERFORMED
Amphetamines: NOT DETECTED
Barbiturates: NOT DETECTED
Benzodiazepines: NOT DETECTED
Cocaine: NOT DETECTED
Opiates: NOT DETECTED
Tetrahydrocannabinol: NOT DETECTED

## 2021-02-13 LAB — CBC
HCT: 34 % — ABNORMAL LOW (ref 36.0–46.0)
Hemoglobin: 11.4 g/dL — ABNORMAL LOW (ref 12.0–15.0)
MCH: 30.6 pg (ref 26.0–34.0)
MCHC: 33.5 g/dL (ref 30.0–36.0)
MCV: 91.4 fL (ref 80.0–100.0)
Platelets: 217 10*3/uL (ref 150–400)
RBC: 3.72 MIL/uL — ABNORMAL LOW (ref 3.87–5.11)
RDW: 12.7 % (ref 11.5–15.5)
WBC: 11.8 10*3/uL — ABNORMAL HIGH (ref 4.0–10.5)
nRBC: 0 % (ref 0.0–0.2)

## 2021-02-13 LAB — TYPE AND SCREEN
ABO/RH(D): A POS
Antibody Screen: NEGATIVE

## 2021-02-13 LAB — RESP PANEL BY RT-PCR (FLU A&B, COVID) ARPGX2
Influenza A by PCR: NEGATIVE
Influenza B by PCR: NEGATIVE
SARS Coronavirus 2 by RT PCR: NEGATIVE

## 2021-02-13 MED ORDER — LACTATED RINGERS IV SOLN: INTRAVENOUS | Status: DC

## 2021-02-13 MED ORDER — OXYTOCIN-SODIUM CHLORIDE 30-0.9 UT/500ML-% IV SOLN
2.5000 [IU]/h | INTRAVENOUS | Status: DC
  Filled 2021-02-13: qty 500

## 2021-02-13 MED ORDER — EPHEDRINE 5 MG/ML INJ: 10.0000 mg | INTRAVENOUS | Status: DC | PRN

## 2021-02-13 MED ORDER — PHENYLEPHRINE 40 MCG/ML (10ML) SYRINGE FOR IV PUSH (FOR BLOOD PRESSURE SUPPORT): 80.0000 ug | PREFILLED_SYRINGE | INTRAVENOUS | Status: DC | PRN

## 2021-02-13 MED ORDER — SOD CITRATE-CITRIC ACID 500-334 MG/5ML PO SOLN: 30.0000 mL | ORAL | Status: DC | PRN

## 2021-02-13 MED ORDER — LACTATED RINGERS IV SOLN
500.0000 mL | Freq: Once | INTRAVENOUS | Status: AC
  Administered 2021-02-13: 500 mL via INTRAVENOUS

## 2021-02-13 MED ORDER — LACTATED RINGERS IV SOLN
500.0000 mL | INTRAVENOUS | Status: DC | PRN
  Administered 2021-02-13 (×2): 500 mL via INTRAVENOUS

## 2021-02-13 MED ORDER — PENICILLIN G POT IN DEXTROSE 60000 UNIT/ML IV SOLN
3.0000 10*6.[IU] | INTRAVENOUS | Status: DC
  Administered 2021-02-13 (×2): 3 10*6.[IU] via INTRAVENOUS
  Filled 2021-02-13 (×2): qty 50

## 2021-02-13 MED ORDER — OXYCODONE-ACETAMINOPHEN 5-325 MG PO TABS: 1.0000 | ORAL_TABLET | ORAL | Status: DC | PRN

## 2021-02-13 MED ORDER — OXYTOCIN BOLUS FROM INFUSION
333.0000 mL | Freq: Once | INTRAVENOUS | Status: AC
  Administered 2021-02-14: 333 mL via INTRAVENOUS

## 2021-02-13 MED ORDER — OXYTOCIN-SODIUM CHLORIDE 30-0.9 UT/500ML-% IV SOLN
1.0000 m[IU]/min | INTRAVENOUS | Status: DC
  Administered 2021-02-13: 2 m[IU]/min via INTRAVENOUS

## 2021-02-13 MED ORDER — SODIUM CHLORIDE 0.9 % IV SOLN
5.0000 10*6.[IU] | Freq: Once | INTRAVENOUS | Status: AC
  Administered 2021-02-13: 5 10*6.[IU] via INTRAVENOUS
  Filled 2021-02-13: qty 5

## 2021-02-13 MED ORDER — LIDOCAINE HCL (PF) 1 % IJ SOLN
INTRAMUSCULAR | Status: DC | PRN
  Administered 2021-02-13 (×2): 4 mL via EPIDURAL

## 2021-02-13 MED ORDER — LIDOCAINE HCL (PF) 1 % IJ SOLN: 30.0000 mL | INTRAMUSCULAR | Status: DC | PRN

## 2021-02-13 MED ORDER — ACETAMINOPHEN 325 MG PO TABS: 650.0000 mg | ORAL_TABLET | ORAL | Status: DC | PRN

## 2021-02-13 MED ORDER — LACTATED RINGERS IV SOLN: 500.0000 mL | Freq: Once | INTRAVENOUS | Status: DC

## 2021-02-13 MED ORDER — FENTANYL-BUPIVACAINE-NACL 0.5-0.125-0.9 MG/250ML-% EP SOLN
12.0000 mL/h | EPIDURAL | Status: DC | PRN
  Administered 2021-02-13: 12 mL/h via EPIDURAL
  Filled 2021-02-13: qty 250

## 2021-02-13 MED ORDER — ONDANSETRON HCL 4 MG/2ML IJ SOLN: 4.0000 mg | Freq: Four times a day (QID) | INTRAMUSCULAR | Status: DC | PRN

## 2021-02-13 MED ORDER — OXYCODONE-ACETAMINOPHEN 5-325 MG PO TABS: 2.0000 | ORAL_TABLET | ORAL | Status: DC | PRN

## 2021-02-13 MED ORDER — DIPHENHYDRAMINE HCL 50 MG/ML IJ SOLN: 12.5000 mg | INTRAMUSCULAR | Status: DC | PRN

## 2021-02-13 NOTE — Progress Notes (Signed)
Labor Progress Note Amber Maynard is a 25 y.o. G1P0000 at 60w3dpresented for PPROM  S: Doing well on epidural. Able to palpate contractions on her belly, but otherwise note feeling pain or pressure. Has had some bloody discharge so is wearing a diaper and has a pad in place.   O:  BP 129/85   Pulse 90   Temp 97.8 F (36.6 C) (Oral)   Resp 18   Ht 5' 1"  (1.549 m)   Wt 85.7 kg   LMP 05/21/2020   SpO2 100%   BMI 35.71 kg/m  EFM: baseline 125/moderate variability/+accels/ocassional early and variable decels Toco: contractions q2-3 minutes  CVE: Dilation: 4.5 Effacement (%): 100 Station: -1 Presentation: Vertex Exam by:: FVertell Novak CNM   A&P: 25y.o. G1P0000 321w3dor PPROM #PPROM: Latent labor, cervix changing nicely since last exam. No fevers. Will continue uptitrating pitocin, currently running at 124min #Pain: epidural #FWB: cat 2 strip #GBS positive, on PCN ppx #Substance use disorder: UDS negative, patient declines buprenorphine (recently weaned off). #Tobacco use: nicotine replacement if desired #HCV infection: planning for treatment postpartum, no FSE during labor #Rubella non-immune: MMR postpartum  WilMee HivesD 8:59 PM

## 2021-02-13 NOTE — Progress Notes (Signed)
   HIGH-RISK PREGNANCY VISIT Patient name: Amber Maynard MRN [id number]  Date of birth: [birth year] Chief Complaint:   work in ob (Possible rupture)  History of Present Illness:   Amber Maynard is a 25 y.o. G1P0000 female at [redacted]w[redacted]d with an Estimated Date of Delivery: 03/10/21 being seen today for ongoing management of a high-risk pregnancy complicated by IVDU.    Today she reports no complaints and gush of clear fluid at 0900. Contractions: Not present. Vag. Bleeding: None.  Movement: Present. reports leaking of fluid.   Depression screen PHQ 2/9 08/26/2020  Decreased Interest 1  Down, Depressed, Hopeless 1  PHQ - 2 Score 2  Altered sleeping 1  Tired, decreased energy 1  Change in appetite 2  Feeling bad or failure about yourself  1  Trouble concentrating 0  Moving slowly or fidgety/restless 0  Suicidal thoughts 0  PHQ-9 Score 7     GAD 7 : Generalized Anxiety Score 08/26/2020  Nervous, Anxious, on Edge 0  Control/stop worrying 0  Worry too much - different things 0  Trouble relaxing 1  Restless 0  Easily annoyed or irritable 1  Afraid - awful might happen 0  Total GAD 7 Score 2     Review of Systems:   Pertinent items are noted in HPI Denies abnormal vaginal discharge w/ itching/odor/irritation, headaches, visual changes, shortness of breath, chest pain, abdominal pain, severe nausea/vomiting, or problems with urination or bowel movements unless otherwise stated above. Pertinent History Reviewed:  Reviewed past medical,surgical, social, obstetrical and family history.  Reviewed problem list, medications and allergies. Physical Assessment:   Vitals:   02/13/21 0952  BP: 118/76  Pulse: (!) 101  Weight: 189 lb (85.7 kg)  Body mass index is 35.71 kg/m.           Physical Examination:   General appearance: alert, well appearing, and in no distress  Mental status: alert, oriented to person, place, and time  Skin: warm & dry   Extremities: Edema: Mild pitting,  slight indentation    Cardiovascular: normal heart rate noted  Respiratory: normal respiratory effort, no distress  Abdomen: gravid, soft, non-tender  Pelvic: Cervical exam performed  Dilation: 3 Effacement (%): 100 Station: -1  Fetal Status: Fetal Heart Rate (bpm): 140 Fundal Height: 33 cm Movement: Present Presentation: Vertex  Fetal Surveillance Testing today: FHR 140   Chaperone: Malachy Mood    No results found for this or any previous visit (from the past 24 hour(s)).  Assessment & Plan:  High-risk pregnancy: G1P0000 at [redacted]w[redacted]d with an Estimated Date of Delivery: 03/10/21   1) SROM clear fluid no labor 0900 this am,   To L&D DR Charlotta Newton notified  Meds: No orders of the defined types were placed in this encounter.   Labs/procedures today: spec exam  Treatment Plan:  [facility]  Reviewed: Preterm labor symptoms and general obstetric precautions including but not limited to vaginal bleeding, contractions, leaking of fluid and fetal movement were reviewed in detail with the patient.  All questions were answered.  have home bp cuff. Office bp cuff given: Marland Kitchen Check bp , let us know if consistently .  Follow-up: Return in about 6 weeks (around 03/27/2021) for post partum visit.   Future Appointments  Date Time Provider Department Center  02/14/2021  9:30 AM Lazaro Arms, MD [facility]    No orders of the defined types were placed in this encounter.  Lazaro Arms  02/13/2021 10:31 AM

## 2021-02-13 NOTE — Anesthesia Preprocedure Evaluation (Signed)
Anesthesia Evaluation  Patient identified by MRN, date of birth, ID band Patient awake    Reviewed: Allergy & Precautions, Patient's Chart, lab work & pertinent test results  History of Anesthesia Complications Negative for: history of anesthetic complications  Airway Mallampati: II  TM Distance: >3 FB Neck ROM: Full    Dental no notable dental hx.    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  Reproductive/Obstetrics (+) Pregnancy                             Anesthesia Physical Anesthesia Plan  ASA: III  Anesthesia Plan: Epidural   Post-op Pain Management:    Induction:   PONV Risk Score and Plan: Treatment may vary due to age or medical condition  Airway Management Planned: Natural Airway  Additional Equipment:   Intra-op Plan:   Post-operative Plan:   Informed Consent: I have reviewed the patients History and Physical, chart, labs and discussed the procedure including the risks, benefits and alternatives for the proposed anesthesia with the patient or authorized representative who has indicated his/her understanding and acceptance.       Plan Discussed with:   Anesthesia Plan Comments:         Anesthesia Quick Evaluation

## 2021-02-13 NOTE — Progress Notes (Addendum)
Labor Progress Note Amber Maynard is a 25 y.o. G1P0000 at 16w3dpresented for PPROM  S: Eating [place]. Has not felt any contractions. When asked about buprenorphine, patient states she has been weaning off of it (prescribed 8 mg daily but has been taking 2 mg daily for the past month). Declines buprenorphine here. Also declines nicotine replacement.  O:  BP 119/63   Pulse 82   Temp 98 F (36.7 C) (Oral)   Resp 18   Ht 5' 1"  (1.549 m)   Wt 85.7 kg   LMP 05/21/2020   SpO2 100%   BMI 35.71 kg/m  EFM: baseline 120/moderate variability/+accels/no decels Toco: rare contractions  CVE: Dilation: 2.5 Effacement (%): 90 Station: -1 Presentation: Vertex Exam by:: H.Price, RN   A&P: 25y.o. G1P0000 336w3dor PPROM #PPROM: cervix without significant change since last exam. Now on low dose Pitocin at 2 mu/min (start at 1512). #Pain: per patient request #FWB: cat I strip #GBS positive, on PCN ppx #Substance use disorder: f/u UDS on admission, patient declines buprenorphine (recently weaned off). #Tobacco use: nicotine replacement if desired #HCV infection: planning for treatment postpartum #Rubella non-immune: plan for MMR postpartum  RiZola ButtonMD 3:30 PM

## 2021-02-13 NOTE — H&P (Addendum)
OBSTETRIC ADMISSION HISTORY AND PHYSICAL  Amber Maynard is a 25 y.o. female G1P0000 with IUP at 12w3dby [place] 758w3dresenting for PPROM from clinic, reported gush of clear fluid this AM at 0900. She reports +FMs, No LOF, no VB.  She plans on breast feeding. She is considering outpatient Nexplanon for birth control.  [facility]. Denies elicit drug use on admission.  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 ADHD (attention deficit hyperactivity disorder)   . Hepatitis C   . Nexplanon in place 03/14/2014    Past Surgical History: Past Surgical History:  Procedure Laterality Date  . NO PAST SURGERIES      Obstetrical History: OB History    Gravida  1   Para  0   Term  0   Preterm  0   AB  0   Living  0     SAB  0   IAB  0   Ectopic  0   Multiple  0   Live Births  0           Social History Social History   Socioeconomic History  . Marital status: Single    Spouse name: Not on file  . Number of children: Not on file  . Years of education: Not on file  . Highest education level: Not on file  Occupational History  . Not on file  Tobacco Use  .  Smoking status: Current Every Day Smoker    Packs/day: 0.50    Types: Cigarettes  . Smokeless tobacco: Never Used  Vaping Use  . Vaping Use: Never used  Substance and Sexual Activity  . Alcohol use: Not Currently  . Drug use: Not Currently    Types: Oxycodone, Heroin  . Sexual activity: Yes    Birth control/protection: None  Other Topics Concern  . Not on file  Social History Narrative  . Not on file   Social Determinants of Health   Financial Resource Strain: Medium Risk  . Difficulty of Paying Living Expenses: Somewhat hard  Food Insecurity: No Food Insecurity  . Worried About RunCharity fundraiser the Last Year: Never true  . Ran Out of Food in the Last Year: Never true  Transportation Needs: Unmet Transportation Needs  . Lack of Transportation (Medical): No  . Lack of Transportation (Non-Medical): Yes  Physical Activity: Insufficiently Active  . Days of Exercise per Week: 2 days  . Minutes of Exercise per Session: 10 min  Stress: No Stress Concern Present  . Feeling of Stress : Only a little  Social Connections: Socially Isolated  . Frequency of Communication with Friends and Family: More than three times a week  . Frequency of Social Gatherings with Friends  and Family: Twice a week  . Attends Religious Services: Never  . Active Member of Clubs or Organizations: No  . Attends Archivist Meetings: Never  . Marital Status: Never married    Family History: History reviewed. No pertinent family history.  Allergies: Allergies  Allergen Reactions  . Adhesive [Tape]     Redness and blisters at place of telemetry leads.   . Bee Venom     Medications Prior to Admission  Medication Sig Dispense Refill Last Dose  . buprenorphine (SUBUTEX) 8 MG SUBL SL tablet Place under the tongue daily. Takes 1/4 tab daily   02/12/2021 at Unknown time  . omeprazole (PRILOSEC) 20 MG capsule Take 20 mg by mouth daily.   02/12/2021 at Unknown time  . Prenatal Vit-Fe Fumarate-FA  (PRENATAL VITAMIN PO) Take by mouth.   02/12/2021 at Unknown time     Review of Systems   All systems reviewed and negative except as stated in HPI  Blood pressure 114/70, pulse 98, temperature 98 F (36.7 C), temperature source Oral, resp. rate 18, height 5' 1"  (1.549 m), weight 85.7 kg, last menstrual period 05/21/2020, SpO2 100 %. General appearance: alert, cooperative, appears stated age and no distress Lungs: nonlabored respirations Heart: regular rate Abdomen: gravid Presentation: cephalic Fetal monitoringBaseline: 120 bpm, Variability: Good {> 6 bpm), Accelerations: multiple and Decelerations: Absent Uterine activity Frequency: Every 7-9 minutes Dilation: 3 (at 0952) Effacement (%): 100 Station: -1 Exam by:: Dr. Elonda Husky   Prenatal labs: ABO, Rh: A/Positive/-- (12/13 1224) Antibody: Negative (12/13 1224) Rubella: <0.90 (12/13 1224) RPR: Non Reactive (12/13 1224)  HBsAg: Negative (12/13 1224)  HIV: Non Reactive (12/13 1224)  GBS:  positive in urine culture 1 hr Glucola 105, wnl Genetic screening Panorama low risk Anatomy US: thickened NT at 12w, nl anatomy on follow-up US at 28w, normal fetal echo  Prenatal Transfer Tool  Maternal Diabetes: No Genetic Screening: Normal Maternal Ultrasounds/Referrals: Other: thickened NT, resolved Fetal Ultrasounds or other Referrals:  Fetal echo Maternal Substance Abuse:  Yes:  Type: Smoker, Other: heroin, fentanyl Significant Maternal Medications:  Meds include: Other: buprenorphine, omeprazole Significant Maternal Lab Results: Group B Strep positive and Other: HCV positive  No results found for this or any previous visit (from the past 24 hour(s)).  Patient Active Problem List   Diagnosis Date Noted  . Supervision of high risk pregnancy, antepartum 02/13/2021  . Rubella non-immune status, antepartum 09/02/2020  . GBS bacteriuria 09/02/2020  . Supervision of normal first pregnancy 08/26/2020  . Hepatitis C 08/26/2020  .  Trichomonas infection 07/23/2020  . IV drug user 07/23/2020  . Accidental drug overdose   . Acute respiratory failure with hypoxia ([place]) 11/27/2019  . Aspiration pneumonitis ([place]) 11/27/2019  . Smoker 11/27/2019  . Acute encephalopathy 11/27/2019  . Respiratory failure ([place]) 11/26/2019    Assessment/Plan:  ARLISA LECLERE is a 25 y.o. G1P0000 at 58w3dhere for PPROM.  #PPROM: cervix favorable, expectant management for now per patient preference; may consider starting low-dose pitocin at next check if unchanged. #Pain: Per patient request #FWB: Cat I strip #ID: GBS bacteriuria this pregnancy; PCN ppx started on admission #MOF: breast #MOC: OP Nexplanon #Circ:  N/a #Substance Use Disorder: f/u UDS on admission. Will continue suboxone s/p confirmation of dosing with OB pharmacist based on fill history. #HCV infection: planning for treatment postpartum #Rubella non-immune: plan for MMR postpartum  RZola Button MD  02/13/2021, 12:44 PM  Attestation of Supervision of Resident:  I confirm that I have  verified the information documented in the resident's note and that I have also personally reperformed the history, physical exam and all medical decision making activities.  I have verified that all services and findings are accurately documented in this student's note; and I agree with management and plan as outlined in the documentation. I have also made any necessary editorial changes.  Randa Ngo, [place] for [employer], [place] [facility] 02/13/2021 3:24 PM

## 2021-02-13 NOTE — Anesthesia Procedure Notes (Signed)
Epidural Patient location during procedure: OB Start time: 02/13/2021 6:22 PM End time: 02/13/2021 6:26 PM  Staffing Anesthesiologist: Kaylyn Layer, MD Performed: anesthesiologist   Preanesthetic Checklist Completed: patient identified, IV checked, risks and benefits discussed, monitors and equipment checked, pre-op evaluation and timeout performed  Epidural Patient position: sitting Prep: DuraPrep and site prepped and draped Patient monitoring: continuous pulse ox, blood pressure and heart rate Approach: midline Location: L3-L4 Injection technique: LOR air  Needle:  Needle type: Tuohy  Needle gauge: 17 G Needle length: 9 cm Needle insertion depth: 6 cm Catheter type: closed end flexible Catheter size: 19 Gauge Catheter at skin depth: 11 cm Test dose: negative and Other (1% lidocaine)  Assessment Events: blood not aspirated, injection not painful, no injection resistance, no paresthesia and negative IV test  Additional Notes Patient identified. Risks, benefits, and alternatives discussed with patient including but not limited to bleeding, infection, nerve damage, paralysis, failed block, incomplete pain control, headache, blood pressure changes, nausea, vomiting, reactions to medication, itching, and postpartum back pain. Confirmed with bedside nurse the patient's most recent platelet count. Confirmed with patient that they are not currently taking any anticoagulation, have any bleeding history, or any family history of bleeding disorders. Patient expressed understanding and wished to proceed. All questions were answered. Sterile technique was used throughout the entire procedure. Please see nursing notes for vital signs.   Crisp LOR on first pass. Test dose was given through epidural catheter and negative prior to continuing to dose epidural or start infusion. Warning signs of high block given to the patient including shortness of breath, tingling/numbness in hands, complete  motor block, or any concerning symptoms with instructions to call for help. Patient was given instructions on fall risk and not to get out of bed. All questions and concerns addressed with instructions to call with any issues or inadequate analgesia.  Reason for block:procedure for pain

## 2021-02-14 ENCOUNTER — Encounter (HOSPITAL_COMMUNITY): Payer: Self-pay | Admitting: Obstetrics & Gynecology

## 2021-02-14 ENCOUNTER — Encounter: Payer: Medicaid Other | Admitting: Obstetrics & Gynecology

## 2021-02-14 DIAGNOSIS — Z3A36 36 weeks gestation of pregnancy: Secondary | ICD-10-CM

## 2021-02-14 DIAGNOSIS — O99324 Drug use complicating childbirth: Secondary | ICD-10-CM

## 2021-02-14 DIAGNOSIS — O42913 Preterm premature rupture of membranes, unspecified as to length of time between rupture and onset of labor, third trimester: Secondary | ICD-10-CM

## 2021-02-14 DIAGNOSIS — O98413 Viral hepatitis complicating pregnancy, third trimester: Secondary | ICD-10-CM

## 2021-02-14 DIAGNOSIS — O99824 Streptococcus B carrier state complicating childbirth: Secondary | ICD-10-CM

## 2021-02-14 LAB — RPR: RPR Ser Ql: NONREACTIVE

## 2021-02-14 MED ORDER — DIBUCAINE (PERIANAL) 1 % EX OINT: 1.0000 "application " | TOPICAL_OINTMENT | CUTANEOUS | Status: DC | PRN

## 2021-02-14 MED ORDER — COCONUT OIL OIL: 1.0000 "application " | TOPICAL_OIL | Status: DC | PRN

## 2021-02-14 MED ORDER — SIMETHICONE 80 MG PO CHEW: 80.0000 mg | CHEWABLE_TABLET | ORAL | Status: DC | PRN

## 2021-02-14 MED ORDER — WITCH HAZEL-GLYCERIN EX PADS: 1.0000 "application " | MEDICATED_PAD | CUTANEOUS | Status: DC | PRN

## 2021-02-14 MED ORDER — ACETAMINOPHEN 325 MG PO TABS
650.0000 mg | ORAL_TABLET | ORAL | Status: DC | PRN
  Administered 2021-02-15: 650 mg via ORAL
  Filled 2021-02-14: qty 2

## 2021-02-14 MED ORDER — ONDANSETRON HCL 4 MG PO TABS: 4.0000 mg | ORAL_TABLET | ORAL | Status: DC | PRN

## 2021-02-14 MED ORDER — DIPHENHYDRAMINE HCL 25 MG PO CAPS: 25.0000 mg | ORAL_CAPSULE | Freq: Four times a day (QID) | ORAL | Status: DC | PRN

## 2021-02-14 MED ORDER — PRENATAL MULTIVITAMIN CH
1.0000 | ORAL_TABLET | Freq: Every day | ORAL | Status: DC
  Administered 2021-02-14 – 2021-02-16 (×3): 1 via ORAL
  Filled 2021-02-14 (×3): qty 1

## 2021-02-14 MED ORDER — IBUPROFEN 600 MG PO TABS
600.0000 mg | ORAL_TABLET | Freq: Four times a day (QID) | ORAL | Status: DC
  Administered 2021-02-14 – 2021-02-16 (×11): 600 mg via ORAL
  Filled 2021-02-14 (×12): qty 1

## 2021-02-14 MED ORDER — MEASLES, MUMPS & RUBELLA VAC IJ SOLR
0.5000 mL | Freq: Once | INTRAMUSCULAR | Status: AC
  Administered 2021-02-16: 0.5 mL via SUBCUTANEOUS
  Filled 2021-02-14: qty 0.5

## 2021-02-14 MED ORDER — SENNOSIDES-DOCUSATE SODIUM 8.6-50 MG PO TABS
2.0000 | ORAL_TABLET | Freq: Every day | ORAL | Status: DC
  Administered 2021-02-15 – 2021-02-16 (×2): 2 via ORAL
  Filled 2021-02-14 (×2): qty 2

## 2021-02-14 MED ORDER — ONDANSETRON HCL 4 MG/2ML IJ SOLN: 4.0000 mg | INTRAMUSCULAR | Status: DC | PRN

## 2021-02-14 MED ORDER — BENZOCAINE-MENTHOL 20-0.5 % EX AERO: 1.0000 "application " | INHALATION_SPRAY | CUTANEOUS | Status: DC | PRN

## 2021-02-14 NOTE — Lactation Note (Signed)
This note was copied from a baby's chart. Lactation Consultation Note Baby is 20 hrs old. Mom states she is breast/formula feeding. Mom has only put the baby to the breast 2 times. Mom stated she doesn't do well on the breast. Mom has inverted nipples. Everts w/stimulation. Compressible.  DEBP set up at bedside. Mom stated she has pumped once but didn't get anything. Explained that is normal. Encouraged to pump Q 3hrs. Shown mom how to use DEBP. Fitted flanges.  LPI information sheet given and reviewed. Mom stated she needs to sleep but when she puts baby down she cries. Asked about diaper. Mom stated she just changed one. LC demonstrated swaddling. Baby quiet and sleeping. Mom stated she needed to go to her car. LC suggested take baby to SCN while mom goes to car.  Mom lives in [place]. LC will send [facility] referral. Encouraged mom to call for LC to assist in latching. Lactation brochure given.  Patient Name: Amber Maynard [facility] Date: 02/14/2021 Reason for consult: Initial assessment;Primapara;Late-preterm 34-36.6wks Age:28 hours  Maternal Data    Feeding Nipple Type: Extra Slow Flow  LATCH Score        Type of Nipple: Inverted  Comfort (Breast/Nipple): Soft / non-tender         Lactation Tools Discussed/Used Tools: Pump;Shells Breast pump type: Double-Electric Breast Pump Pump Education: Setup, frequency, and cleaning;Milk Storage Reason for Pumping: LPI Pumping frequency: Q 3hr  Interventions Interventions: Breast feeding basics reviewed;DEBP;Breast compression;Pre-pump if needed  Discharge WIC Program: No  Consult Status Consult Status: Follow-up Date: 02/15/21 Follow-up type: In-patient    Charyl Dancer 02/14/2021, 8:59 PM

## 2021-02-14 NOTE — Progress Notes (Signed)
Upon entering room, this RN noted pt holding infant with smoke coming from patient's mouth and a vape pen in her other hand. After pt seen the nurse, she attempted to wave smoke away with her hand. This RN explained to pt that she is not allowed to use the vape pen while in the hospital. Patient states "well it doesn't have nicotine in it," this RN further reiterated to patient that she is not allowed to smoke a vape pen while in the hospital.  This RN informed Everlean Alstrom, Charge Nurse and B. Yetta Barre, [facility] of happenings.

## 2021-02-14 NOTE — Clinical Social Work Maternal (Addendum)
CLINICAL SOCIAL WORK MATERNAL/CHILD NOTE  Patient Details  Name: Amber Maynard MRN: [id number] Date of Birth: Apr 22, 1996  Date:  02/14/2021  Clinical Social Worker Initiating Note:  [name], [place] Date/Time: Initiated:  02/14/21/1500     Child's Name:  Amber Maynard   Biological Parents:  Mother   Need for Interpreter:  None   Reason for Referral:  Current Substance Use/Substance Use During Pregnancy    Address:  [address] 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   Education:  9 to 11 years   Homebound arranged: No  Financial Resources:  [employer]   Other Resources:  [employer]   Cultural/Religious Considerations Which May Impact Care:    Strengths:  Ability to meet basic needs ,Home prepared for child    Psychotropic Medications:         Pediatrician:     MOB still choosing a Armed forces training and education officer List:   [place]      Pediatrician Fax Number:    Risk Factors/Current Problems:  Substance Use    Cognitive State:  Able to Concentrate ,Alert ,Linear Thinking ,Insightful    Mood/Affect:  Comfortable ,Calm ,Bright ,Happy    CSW Assessment: CSW received consult for hx of suboxone use, opioid use disorder, prior incarceration CSW met with MOB to  offer support and complete assessment.    CSW met with at bedside. CSW observed MOB bonding infant as evidenced by holding the infant. CSW congratulated MOB and introduced role. CSW confirm MOB demographic information and household members. MOB confirmed her demographic information was correct and that she lives in the home with her grandparents and mother (See chart above). CSW inquired about FOB involvement. MOB reports FOB is her significant other Marcy Salvo (06-22-83) and he has been involved however they plan to get blood test just to be sure. CSW inquired how MOB has felt since giving birth. MOB reports feeling good. CSW inquired about MOB supports. MOB acknowledges FOB, grandparents and mother as supports.    CSW inquired about MOB history of substance use. MOB reports the last time she used was in December 2021. MOB reports at the time she was using " fentanyl and all kinds of stuff, it did not matter." MOB reports she was incarcerated December 2021 for violating her probation and was released April 2022. MOB reports her previous incarceration does not relate to her ability to care for her child. MOB reports while incarcerated she started to withdrawal, so she was enrolled in MAT program and prescribed Suboxone. MOB reports after her release she enrolled at the [facility] in [place] and was prescribed Subutex because the clinic did not offer Suboxone. MOB reports last week she weaned herself off the Subutex. MOB reports sobriety since December and is hoping to stay sober. CSW informed MOB about the hospital drug screen policy, if infant's UDS, CDS are positive for substance then a notification/report will be made to CPS. MOB report understanding and had no questions.   CSW inquired if MOB was interested in substance use treatment programs. MOB reports she has information for [facility] and has done research on a sober living home in [place] area. CSW educated MOB about  treatment option and provided resources. CSW educated MOB about the Healthy [facility] wellness community program infant's and mothers.  MOB gave CSW permission to make a referral MOB very appreciative of the resources provided.   CSW inquired if MOB has mental health history. MOB denies mental health history. CSW assessed MOB for safety. MOB denies thoughts to harm self and others. MOB denies domestic violence history. CSW provided education regarding the baby blues period vs. perinatal mood disorders, discussed treatment and gave resources for mental health follow up if concerns arise.  CSW recommends self-evaluation during the postpartum time period using the New Mom Checklist from Postpartum Progress and encouraged MOB to contact a medical professional if symptoms are noted at any time.  MOB receptive to the resources provided.   CSW provided review of Sudden Infant Death Syndrome (SIDS) precautions and informed MOB no-co sleeping with the infant. MOB reports the infant will sleep in a bassinet. CSW inquired if MOB has essential items for infant. MOB reports she has essential items for the infant including a car seat. MOB still in the process of choosing a Pediatrician. MOB reprots she will not have issues with transportation. CSW assessed MOB for additional needs. MOB reports no further need.   CSW will continue to follow UDS, CDS and make a report, if warranted. Currently, no barriers to discharge.  CSW Plan/Description:Perinatal Mood and Anxiety Disorder (PMADs) Education,Sudden Infant Death Syndrome (SIDS) Education,CSW Will Continue to Monitor Umbilical Cord Tissue Drug Screen Results and Make Report if [facility] Drug Screen Policy Information,Other Information/Referral to [employer], [place] 02/14/2021, 3:13 PM

## 2021-02-14 NOTE — Anesthesia Postprocedure Evaluation (Signed)
Anesthesia Post Note  Patient: Social research officer, government  Procedure(s) Performed: AN AD HOC LABOR EPIDURAL     Patient location during evaluation: Mother Baby Anesthesia Type: Epidural Level of consciousness: awake and alert and oriented Pain management: satisfactory to patient Vital Signs Assessment: post-procedure vital signs reviewed and stable Respiratory status: spontaneous breathing and nonlabored ventilation Cardiovascular status: stable Postop Assessment: no headache, no backache, no signs of nausea or vomiting, adequate PO intake, patient able to bend at knees and able to ambulate (patient up walking) Anesthetic complications: no   No complications documented.  Last Vitals:  Vitals:   02/14/21 0325 02/14/21 0539  BP: 125/81 122/75  Pulse: 92 91  Resp: 16 16  Temp: 36.8 C 36.7 C  SpO2: 100%     Last Pain:  Vitals:   02/14/21 0539  TempSrc: Oral  PainSc: 0-No pain   Pain Goal:                   Madison Hickman

## 2021-02-14 NOTE — Discharge Summary (Addendum)
Postpartum Discharge Summary  Date of Service updated 02/16/21     Patient Name: Amber Maynard DOB: 07-Apr-1996 MRN: [id number]  Date of admission: 02/13/2021 Delivery date:02/14/2021  Delivering provider: Dulce Sellar B  Date of discharge: 02/16/2021  Admitting diagnosis: Supervision of high risk pregnancy, antepartum [O09.90] Intrauterine pregnancy: [redacted]w[redacted]d    Secondary diagnosis:  Active Problems:   Supervision of high risk pregnancy, antepartum  Additional problems: PPROM; Hx IVDU; Hep C    Discharge diagnosis: Preterm Pregnancy Delivered                                              Post partum procedures:none Augmentation: Pitocin Complications: None  Hospital course: Onset of Labor With Vaginal Delivery      25y.o. yo G1P0000 at 372w4das admitted in Latent Labor on 02/13/2021. Patient had an uncomplicated labor course as follows:  Membrane Rupture Time/Date:  ,   Delivery Method:Vaginal, Spontaneous  Episiotomy: None  Lacerations:  1st degree  Patient had an uncomplicated postpartum course.  She is ambulating, tolerating a regular diet, passing flatus, and urinating well. Patient is discharged home in stable condition on 02/16/21. CNM discussed risk of relapse of OUD during PP period. Recommended considering continuing MAT for a few months but pt declined. Verbalized understanding that risk of relapse of IVDU is far greater than that risk of MAT. Recommended mood check in 1-2 weeks--> declined.   Newborn Data: Birth date:02/14/2021  Birth time:12:50 AM  Gender:Female  Living status:Living  Apgars:9 ,9  Weight:2370 g   Magnesium Sulfate received: No BMZ received: No Rhophylac:N/A MMR:N/A T-DaP:Given prenatally Flu: No Transfusion:No  Physical exam  Vitals:   02/15/21 0505 02/15/21 1500 02/15/21 2036 02/16/21 0510  BP: 111/62 124/85 126/88 128/79  Pulse: 67 64 73 78  Resp: 18 18 16 18   Temp: 97.8 F (36.6 C) 98.5 F (36.9 C) 98 F (36.7 C) 98.7 F  (37.1 C)  TempSrc: Oral Oral Oral Oral  SpO2:   99% 99%  Weight:      Height:       General: alert, cooperative and no distress Lochia: appropriate Uterine Fundus: firm Incision: N/A DVT Evaluation: No evidence of DVT seen on physical exam. Labs: Lab Results  Component Value Date   WBC 11.8 (H) 02/13/2021   HGB 11.4 (L) 02/13/2021   HCT 34.0 (L) 02/13/2021   MCV 91.4 02/13/2021   PLT 217 02/13/2021   CMP Latest Ref Rng & Units 07/22/2020  Glucose 70 - 99 mg/dL 84  BUN 6 - 20 mg/dL 12  Creatinine 0.44 - 1.00 mg/dL 0.62  Sodium 135 - 145 mmol/L 133(L)  Potassium 3.5 - 5.1 mmol/L 3.1(L)  Chloride 98 - 111 mmol/L 96(L)  CO2 22 - 32 mmol/L 26  Calcium 8.9 - 10.3 mg/dL 9.6  Total Protein 6.5 - 8.1 g/dL 8.4(H)  Total Bilirubin 0.3 - 1.2 mg/dL 1.5(H)  Alkaline Phos 38 - 126 U/L 58  AST 15 - 41 U/L 33  ALT 0 - 44 U/L 63(H)   EdFlavia Shippercore: Edinburgh Postnatal Depression Scale Screening Tool 02/14/2021  I have been able to laugh and see the funny side of things. 0  I have looked forward with enjoyment to things. 0  I have blamed myself unnecessarily when things went wrong. 2  I have been anxious or worried for no  good reason. 0  I have felt scared or panicky for no good reason. 0  Things have been getting on top of me. 0  I have been so unhappy that I have had difficulty sleeping. 0  I have felt sad or miserable. 1  I have been so unhappy that I have been crying. 1  The thought of harming myself has occurred to me. 0  Edinburgh Postnatal Depression Scale Total 4     After visit meds:  Allergies as of 02/16/2021      Reactions   Adhesive [tape]    Redness and blisters at place of telemetry leads.    Bee Venom       Medication List    STOP taking these medications   buprenorphine 8 MG Subl SL tablet Commonly known as: SUBUTEX   omeprazole 20 MG capsule Commonly known as: PRILOSEC     TAKE these medications   acetaminophen 325 MG tablet Commonly known as:  Tylenol Take 2 tablets (650 mg total) by mouth every 4 (four) hours as needed (for pain scale < 4).   ibuprofen 600 MG tablet Commonly known as: ADVIL Take 1 tablet (600 mg total) by mouth every 6 (six) hours.   PRENATAL VITAMIN PO Take by mouth.        Discharge home in stable condition Infant Feeding: Breast Infant Disposition:home with mother Discharge instruction: per After Visit Summary and Postpartum booklet. Activity: Advance as tolerated. Pelvic rest for 6 weeks.  Diet: routine diet Future Appointments: Future Appointments  Date Time Provider [place]  03/27/2021 11:10 AM Cresenzo-Dishmon, Joaquim Lai, CNM [facility]   Follow up Visit:   Please schedule this patient for a In person postpartum visit in 4 weeks with the following provider: Any provider. Additional Postpartum F/U:  High risk pregnancy complicated by: Hep C Delivery mode:  Vaginal, Spontaneous  Anticipated Birth Control:  Depo given IP   02/16/2021 Mee Hives, MD  I was present for the exam and agree with above.  Tamala Julian, [place], [place] 02/16/2021 5:22 PM

## 2021-02-14 NOTE — Progress Notes (Signed)
RN was packing up patient belongings to transport patient up to post partum unit; when cleaning out pt refrigerator, a bottle of pink lemonade vodka was found. Pt stated it was her mothers. This RN notified charge nurse and bottle was disposed of by CN. Security and Mother Baby CN were made aware. Safety zone was submitted.

## 2021-02-14 NOTE — Lactation Note (Signed)
This note was copied from a baby's chart. Lactation Consultation Note  Patient Name: Amber Maynard Today's Date: 02/14/2021 Reason for consult: L&D Initial assessment;1st time breastfeeding;Late-preterm 34-36.6wks Age:23 hours  Per mom, she saw breast changes in her pregnancy and she leaked the 3rd trimester of her pregnancy. LC entered L&D room, mom was doing STS with infant, infant was cuing to breastfeed. Mom attempted to latch infant on her left breast using the cross cradle hold, infant would not sustain latch, mom has flat inverted nipples. Mom hand expressed 8 mls of colostrum that was spoon fed to infant. Mom knows to breastfeed infant according to primal cues: licking, kissing, tasting, hands or fist in mouth, STS. LC discussed infant's input and output with parents.  Mom knows RN and [place] on MBU will further assist her with latching infant at the breast.  [place] talked with MBU- RN Janett Billow), RN will assist mom on MBU on how to wear breast shells during the day, pre-pumping breast with hand pump prior to latching infant at the breast. RN explain to mom to use DEBP every 3 hours for 15 minutes and give infant back any EBM after latching infant at the breast. LC placed hand pump, breast shells and DEBP kit in patient's room ( 510) on MBU.  Maternal Data Has patient been taught Hand Expression?: Yes Does the patient have breastfeeding experience prior to this delivery?: No  Feeding Mother's Current Feeding Choice: Breast Milk  LATCH Score Latch: Repeated attempts needed to sustain latch, nipple held in mouth throughout feeding, stimulation needed to elicit sucking reflex.  Audible Swallowing: None  Type of Nipple: Inverted  Comfort (Breast/Nipple): Soft / non-tender  Hold (Positioning): Assistance needed to correctly position infant at breast and maintain latch.  LATCH Score: 4   Lactation Tools Discussed/Used Tools: Shells;Pump Breast pump type: Double-Electric Breast  Pump Pump Education: Setup, frequency, and cleaning;Milk Storage Reason for Pumping: Infant is LPTI ,did not sustain latch in L&D, mom has flat and inverted nipples Pumping frequency: MBU -(RN) will discussed with mom to pump every 3 hours for 15 minutes on inital setting  Interventions Interventions: Breast feeding basics reviewed;Skin to skin;Hand express;Pre-pump if needed;Adjust position;Support pillows;Position options;Expressed milk;Breast compression;Education  Discharge Pump: Manual;DEBP WIC Program: No  Consult Status Consult Status: Follow-up Date: 02/14/21 Follow-up type: In-patient    Vicente Serene 02/14/2021, 2:18 AM

## 2021-02-15 NOTE — Progress Notes (Addendum)
POSTPARTUM PROGRESS NOTE  Post Partum Day 1  Subjective:  Amber Maynard is a 25 y.o. G1P0101 s/p SVD at [redacted]w[redacted]d  She reports she is doing well. No acute events overnight. She denies any problems with ambulating, voiding or po intake. Denies nausea or vomiting. Reports she had a bowel movement. Pain is well controlled.  Lochia is minimal. Pt very sleepy during exam, feel asleep multiple times while talking with her.  Objective: Blood pressure 111/62, pulse 67, temperature 97.8 F (36.6 C), temperature source Oral, resp. rate 18, height 5' 1"  (1.549 m), weight 85.7 kg, last menstrual period 05/21/2020, SpO2 99 %, unknown if currently breastfeeding.  Physical Exam:  General: alert, cooperative and no distress Chest: no respiratory distress Heart:regular rate, distal pulses intact Abdomen: soft, nontender Uterine Fundus: firm, appropriately tender DVT Evaluation: No calf swelling or tenderness Extremities: no edema Skin: warm, dry  Recent Labs    02/13/21 1228  HGB 11.4*  HCT 34.0*    Assessment/Plan: Amber Maynard a 25y.o. G1P0101 s/p SVD at 368w4d PPD#1 - Doing well. Continue routine postpartum care.  Substance use disorder: UDS negative on admission. Recently weaned off suboxone. Will need further discussion when pt is more awake as to whether she wants to remain off of suboxone. Bottle of vodka was found in fridge after delivery, but pt reports it was her mothers. This was confirmed by RN's. Was found using vape pen in room yesterday. HCV+: pp treatment Rubella NI: MMR pp Contraception: nexplanon outpatient Feeding: breast Dispo: Likely discharge 6/5.   LOS: 2 days    WiAudree BaneD, PGY-1 OBGYN Faculty Teaching Service  02/15/2021, 5:38 AM   I saw and evaluated the patient. I agree with the findings and the plan of care as documented in the resident's note. I spoke with patient regarding subutex and she endorses weaning off of it. I asked if she was  interested in restarting a maintenance therapy such as suboxone and she declines. She reports good support at home. SW has seen patient and cleared her for discharge. Will continue to monitor.   JuSharene SkeansMD OBEast [facility] D[facility] Urocenteramily Medicine Fellow, FaNorth Valley Hospitalor WoMem[facility] SurgeryCoBarton Hills

## 2021-02-15 NOTE — Lactation Note (Signed)
This note was copied from a baby's chart. Lactation Consultation Note  Patient Name: Amber Maynard XNATF'T Date: 02/15/2021 Reason for consult: Follow-up assessment;Late-preterm 34-36.6wks;Primapara;1st time breastfeeding;Infant < 6lbs;Infant weight loss Age:25 years  Visited with mom of 40 hours old LPI female, she's a P1 and reported (++) breast changes during the pregnancy. Mom has decided not to put baby to breast, she's pumping and bottle feeding. Discussed with Dr. Manson Passey about her Hep C (+) status, and mom is aware that pumping should be interrupted in her nipples start bleeding. Will add coconut oil to her pumping sessions once she goes home.  Mom reported pumping is going well, and she's ready to start using her own EBM. She still has some Similac 22 calorie formula in the room for supplementation purposes in case baby wants more. Reviewed normal newborn behavior, size of baby's stomach, LPI policy, pumping schedule and breastmilk storage guidelines.  Feeding plan:  1. Encouraged mom to continue pumping every 3 hours 2. She'll continue supplementing baby with EBM/formula every 3 hours as well 3. Will call for assistance PRN  No support person in mom's room at the time of [facility] consultation. Mom reported all questions and concerns were answered, she's aware of LC OP services and will call PRN.  Maternal Data    Feeding Mother's Current Feeding Choice: Breast Milk and Formula Nipple Type: Slow - flow   Lactation Tools Discussed/Used Tools: Pump;Flanges Flange Size: 24 Breast pump type: Double-Electric Breast Pump;Manual Pump Education: Setup, frequency, and cleaning;Milk Storage Reason for Pumping: LPI < 6 lbs Pumping frequency: q 3 hours Pumped volume: 21 mL  Interventions Interventions: Breast feeding basics reviewed;DEBP;Education  Discharge Pump: DEBP;Manual  Consult Status Consult Status: Follow-up Date: 02/16/21 Follow-up type: In-patient    Delita Chiquito Venetia Constable 02/15/2021, 5:06 PM

## 2021-02-16 MED ORDER — ACETAMINOPHEN 325 MG PO TABS: 650.0000 mg | ORAL_TABLET | ORAL | Status: DC | PRN

## 2021-02-16 MED ORDER — MEDROXYPROGESTERONE ACETATE 150 MG/ML IM SUSP
150.0000 mg | Freq: Once | INTRAMUSCULAR | Status: AC
  Administered 2021-02-16: 150 mg via INTRAMUSCULAR
  Filled 2021-02-16: qty 1

## 2021-02-16 MED ORDER — IBUPROFEN 600 MG PO TABS: 600.0000 mg | ORAL_TABLET | Freq: Four times a day (QID) | ORAL | 0 refills | Status: DC | PRN

## 2021-02-16 MED ORDER — IBUPROFEN 600 MG PO TABS: 600.0000 mg | ORAL_TABLET | Freq: Four times a day (QID) | ORAL | 0 refills | Status: DC

## 2021-02-16 NOTE — Lactation Note (Signed)
This note was copied from a baby's chart. Lactation Consultation Note  Patient Name: Amber Maynard Date: 02/16/2021 Reason for consult: Follow-up assessment;Late-preterm 34-36.6wks;Primapara;1st time breastfeeding;Infant < 6lbs Age:23 hours   P1 mother whose infant is now 76 hours old.  This is a LPTI at 36+4 weeks with a CGA of 36+6 weeks weighing < 6 lbs.  Baby is an ESC baby.  Mother's feeding preference is to pump and bottle feed her EBM.  Baby was swaddled and sucking on a pacifier when I arrived.  Mother with no concerns about caring for her baby.  Mother has been pumping and obtained 40 mls with the last pumping session.  She states she desires to pump one breast at a time and the other breast "shoots out milk" when she pumps.  Mother has extra bottles for milk collection during pumping.  Provided breast pads for leaking.  Praised mother for her pumping efforts and reminded her to feed her EBM prior to any formula supplementation.  Mother verbalized understanding.  Advised volumes of 30+ mls at least every three hours and more if baby desires.    Per RN, mother will be admitted until Wednesday.  She does not have a DEBP for home use.  She currently is not a [facility] participant but plans to apply.  She is from [facility].  Per LC notes, previous LC faxed a referral.  Since it is Sunday, I suggested mother call the [facility] office as soon as possible on Monday to verify receiving the fax and to determine eligibility  to obtain a DEBP as soon as possible after discharge.  Monday day shift LC should follow up.    No support person present at this time.  Mother will call for assistance as needed.  RN updated.   Maternal Data Has patient been taught Hand Expression?: Yes Does the patient have breastfeeding experience prior to this delivery?: No  Feeding Mother's Current Feeding Choice: Breast Milk and Formula  LATCH Score                    Lactation Tools  Discussed/Used Tools: Pump;Flanges Flange Size: 24;27 Breast pump type: Double-Electric Breast Pump;Manual Pump Education:  (No pump review needed) Reason for Pumping: Mother desires to pump and bottle feed only: LPTI  Interventions    Discharge Pump: DEBP;Manual WIC Program: Yes  Consult Status Consult Status: Follow-up Date: 02/17/21 Follow-up type: In-patient    Dora Sims 02/16/2021, 12:25 PM

## 2021-02-16 NOTE — Discharge Instructions (Signed)
Perinatal Depression When a woman feels excessive sadness, anger, or anxiety during pregnancy or during the first 12 months after she gives birth, she has a condition called perinatal depression. This can interfere with work, school, relationships, and other everyday activities. If it is not managed properly, it can also interfere with the woman's ability to take care of the baby. Symptoms of perinatal depression may feel worse when living with a newborn. Sometimes, these symptoms are left untreated because they are thought to be normal mood swings during and right after pregnancy. However, if you have intense symptoms of depression that last for more than 2 weeks, it is important to talk with your health care provider. This may be perinatal depression. What are the causes? The exact cause of this condition is not known. Hormonal changes during and after pregnancy may play a role in causing perinatal depression. What increases the risk? You are more likely to develop this condition if:  You have a personal or family history of depression, anxiety, or mood disorders.  You experience a stressful life event during pregnancy, such as the death of a loved one.  You have additional life stress, such as being a single parent.  You do not have support from family members or loved ones, or you are in an abusive relationship.  You have thyroid problems. What are the signs or symptoms? Symptoms of this condition include:  Emotional symptoms, such as: ? Feeling sad or hopeless. ? Feelings of guilt. ? Feeling irritable or overwhelmed.  Physical symptoms, such as: ? Changes with appetite or sleep. ? Lack of energy or motivation. ? Persistent headaches or stomach problems.  Behavioral symptoms, such as: ? Difficulty concentrating or completing tasks. ? Loss of interest in hobbies or relationships. How is this diagnosed? This condition is diagnosed based on a physical exam and mental  evaluation.  In some cases, your health care provider may use a depression screening tool. This includes a list of questions that can help a health care provider diagnose depression.  You may be referred to a mental health expert who specializes in treating perinatal depression. How is this treated? This condition may be treated with:  Talk therapy with a mental health professional. This may be interpersonal psychotherapy, couples therapy, cognitive behavioral therapy, or mother-child bonding therapy.  Medicines. Your health care provider will discuss the safety of the medicines prescribed during pregnancy and breastfeeding.  Support groups.  Brain stimulation or light therapies.  Stress reduction therapies, such as mindfulness.   Follow these instructions at home: Lifestyle  Do not use any products that contain nicotine or tobacco. These products include cigarettes, chewing tobacco, and vaping devices, such as e-cigarettes. If you need help quitting, ask your health care provider.  Do not drink alcohol when you are pregnant. It is also safest not to drink alcohol if you are breastfeeding.  After your baby is born, if you drink alcohol: ? Limit how much you have to 0-1 drink a day. ? Be aware of how much alcohol is in your drink. In the [place] one drink equals one 12 oz bottle of beer (355 mL), one 5 oz glass of wine (148 mL), or one 1 oz glass of hard liquor (44 mL).  Consider joining a support group for new mothers. Ask your health care provider for recommendations.  Take good care of yourself. Make sure you: ? Get as much sleep as possible. Talk with your partner about sharing the responsibility of getting up with   your baby if possible and sharing child care responsibilities equally. Make sleep a priority. ? Eat a healthy diet. This includes plenty of fruits and vegetables, whole grains, and lean proteins. ? Exercise regularly, as told by your health care provider. Ask your health  care provider what exercises are safe for you. Talk with your partner about making sure you both have opportunities to exercise. General instructions  Take over-the-counter and prescription medicines only as told by your health care provider.  Talk with your partner or family members about your feelings during pregnancy. Share any concerns, needs, or anxieties that you may have. Do not be afraid to ask for help. Find a mental health professional, if needed.  Ask for help with tasks or chores when you need it. Ask friends and family members to provide meals, watch your children, or help with cleaning.  Keep all follow-up visits. This is important. Contact a health care provider if:  You or people close to you notice that you have symptoms of depression.  Your symptoms of depression get worse.  You take medicines and have side effects, such as nausea or sleep problems. Get help right away if:  You feel like hurting yourself, your baby, or someone else. If you feel like you may hurt yourself or others, or have thoughts about taking your own life, get help right away. You can go to your nearest emergency department or:  Call your local emergency services (911 in the [place]  Call a suicide crisis helpline, such as the National Suicide Prevention Lifeline, at 303-033-2610. This is open 24 hours a day in the [place]  Text the Crisis Text Line at 567-270-7848 (in the [place] Summary  Perinatal depression is when a woman feels excessive sadness, anger, or anxiety during pregnancy or during the first 12 months after she gives birth.  If perinatal depression is not managed properly, it can interfere with the woman's ability to take care of the baby.  This condition is treated with medicines, talk therapy, stress reduction therapies, or a combination of treatments.  Talk with your partner or family members about your feelings. Ask for help when you need it. This information is not intended to replace  advice given to you by your health care provider. Make sure you discuss any questions you have with your health care provider. Document Revised: 02/23/2020 Document Reviewed: 02/23/2020 Elsevier Patient Education  2021 [employer] Inc[employer]   Postpartum Care After Vaginal Delivery The following information offers guidance about how to care for yourself from the time you deliver your baby to 6-12 weeks after delivery (postpartum period). If you have problems or questions, contact your health care provider for more specific instructions. Follow these instructions at home: Vaginal bleeding  It is normal to have vaginal bleeding (lochia) after delivery. Wear a sanitary pad for bleeding and discharge. ? During the first week after delivery, the amount and appearance of lochia is often similar to a menstrual period. ? Over the next few weeks, it will gradually decrease to a dry, yellow-brown discharge. ? For most women, lochia stops completely by 4-6 weeks after delivery, but can vary.  Change your sanitary pads frequently. Watch for any changes in your flow, such as: ? A sudden increase in volume. ? A change in color. ? Large blood clots.  If you pass a blood clot from your vagina, save it and call your health care provider. Do not flush blood clots down the toilet before talking with your health care provider.  Do not use tampons or douches until your health care provider approves.  If you are not breastfeeding, your period should return 6-8 weeks after delivery. If you are feeding your baby breast milk only, your period may not return until you stop breastfeeding. Perineal care  Keep the area between the vagina and the anus (perineum) clean and dry. Use medicated pads and pain-relieving sprays and creams as directed.  If you had a surgical cut in the perineum (episiotomy) or a tear, check the area for signs of infection until you are healed. Check for: ? More redness, swelling, or pain. ? Fluid  or blood coming from the cut or tear. ? Warmth. ? Pus or a bad smell.  You may be given a squirt bottle to use instead of wiping to clean the perineum area after you use the bathroom. Pat the area gently to dry it.  To relieve pain caused by an episiotomy, a tear, or swollen veins in the anus (hemorrhoids), take a warm sitz bath 2-3 times a day. In a sitz bath, the warm water should only come up to your hips and cover your buttocks.   Breast care  In the first few days after delivery, your breasts may feel heavy, full, and uncomfortable (breast engorgement). Milk may also leak from your breasts. Ask your health care provider about ways to help relieve the discomfort.  If you are breastfeeding: ? Wear a bra that supports your breasts and fits well. Use breast pads to absorb milk that leaks. ? Keep your nipples clean and dry. Apply creams and ointments as told. ? You may have uterine contractions every time you breastfeed for up to several weeks after delivery. This helps your uterus return to its normal size. ? If you have any problems with breastfeeding, notify your health care provider or lactation consultant.  If you are not breastfeeding: ? Avoid touching your breasts. Do not squeeze out (express) milk. Doing this can make your breasts produce more milk. ? Wear a good-fitting bra and use cold packs to help with swelling. Intimacy and sexuality  Ask your health care provider when you can engage in sexual activity. This may depend upon: ? Your risk of infection. ? How fast you are healing. ? Your comfort and desire to engage in sexual activity.  You are able to get pregnant after delivery, even if you have not had your period. Talk with your health care provider about methods of birth control (contraception) or family planning if you desire future pregnancies. Medicines  Take over-the-counter and prescription medicines only as told by your health care provider.  Take an  over-the-counter stool softener to help ease bowel movements as told by your health care provider.  If you were prescribed an antibiotic medicine, take it as told by your health care provider. Do not stop taking the antibiotic even if you start to feel better.  Review all previous and current prescriptions to check for possible transfer into breast milk. Activity  Gradually return to your normal activities as told by your health care provider.  Rest as much as possible. Nap while your baby is sleeping. Eating and drinking  Drink enough fluid to keep your urine pale yellow.  To help prevent or relieve constipation, eat high-fiber foods every day.  Choose healthy eating to support breastfeeding or weight loss goals.  Take your prenatal vitamins until your health care provider tells you to stop.   General tips/recommendations  Do not use any products that  contain nicotine or tobacco. These products include cigarettes, chewing tobacco, and vaping devices, such as e-cigarettes. If you need help quitting, ask your health care provider.  Do not drink alcohol, especially if you are breastfeeding.  Do not take medications or drugs that are not prescribed to you, especially if you are breastfeeding.  Visit your health care provider for a postpartum checkup within the first 3-6 weeks after delivery.  Complete a comprehensive postpartum visit no later than 12 weeks after delivery.  Keep all follow-up visits for you and your baby. Contact a health care provider if:  You feel unusually sad or worried.  Your breasts become red, painful, or hard.  You have a fever or other signs of an infection.  You have bleeding that is soaking through one pad an hour or you have blood clots.  You have a severe headache that doesn't go away or you have vision changes.  You have nausea and vomiting and are unable to eat or drink anything for 24 hours. Get help right away if:  You have chest pain or  difficulty breathing.  You have sudden, severe leg pain.  You faint or have a seizure.  You have thoughts about hurting yourself or your baby. If you ever feel like you may hurt yourself or others, or have thoughts about taking your own life, get help right away. Go to your nearest emergency department or:  Call your local emergency services (911 in the [place]  The National Suicide Prevention Lifeline at 608-008-4142. This suicide crisis helpline is open 24 hours a day.  Text the Crisis Text Line at (302)596-3769 (in the [place] Summary  The period of time after you deliver your newborn up to 6-12 weeks after delivery is called the postpartum period.  Keep all follow-up visits for you and your baby.  Review all previous and current prescriptions to check for possible transfer into breast milk.  Contact a health care provider if you feel unusually sad or worried during the postpartum period. This information is not intended to replace advice given to you by your health care provider. Make sure you discuss any questions you have with your health care provider. Document Revised: 05/16/2020 Document Reviewed: 05/16/2020 Elsevier Patient Education  2021 [employer].

## 2021-02-17 ENCOUNTER — Ambulatory Visit: Payer: Self-pay

## 2021-02-17 NOTE — Lactation Note (Addendum)
This note was copied from a baby's chart. Lactation Consultation Note  Patient Name: Amber Maynard Today's Date: 02/17/2021 Reason for consult: 1st time breastfeeding;Early term 37-38.6wks;Follow-up assessment Age:23 days, P1, -4% weight loss,  infant had 3 voids and 8 stools that are brownish in color. Per mom, infant was given 2 bottles ( 30 mls ) today and she been giving infant some breast milk. Mom's feeding choice is breast milk that is pumped and formula. Mom recently pumped 90 mls of EBM. Mom will give infant 45- 50 mls per feeding due to infant's age on Day 3.  Mom will start  using the DEBP  every 3 hours for 15 minutes on initial setting instead of every 6 hours to establish her milk supply and to prevent becoming engorged.  Mom knows to feed infant according to cues, 8 to 12+ times within 24 hours, and not make infant wait to feed.  The additional EBM was placed in the fridge in patient's room, LC discussed breast milk storage,how to  warm breast milk, additional bottles given for breast milk collection. Mom knows to call RN or LC if she has any breastfeeding questions or concerns.   Maternal Data    Feeding Mother's Current Feeding Choice: Breast Milk and Formula  LATCH Score                    Lactation Tools Discussed/Used Pumping frequency: Mom will start pumping every 3 hours for 15 minutes on inital setting instead of every 6 hours to prevent engorgement and low milk supply. Pumped volume: 90 mL  Interventions    Discharge Pump: DEBP;Manual WIC Program: Yes  Consult Status Consult Status: Follow-up Date: 02/18/21 Follow-up type: In-patient    Danelle Earthly 02/17/2021, 4:51 PM

## 2021-02-19 ENCOUNTER — Ambulatory Visit: Payer: Self-pay

## 2021-02-19 NOTE — Lactation Note (Addendum)
This note was copied from a baby's chart. Lactation Consultation Note  Patient Name: Amber Maynard [id number] Date: 02/19/2021 Reason for consult: Follow-up assessment;Early term 37-38.6wks;1st time breastfeeding;Other (Comment) (2 % weight loss / Pumping and bottle feeding/ milk is in / LC recommended mom call [facility] back today to see if she can get her DEBP today instead of tomorrow.) Age:23 days - LC reviewed the doc flow sheets and baby taking upto 55 ml of EBM from a bottle / also noted formula for a feeding.  Mom / baby ready for D/C as LC entered the room . Baby being placed in the car seat. Mom mentioned she has appt with [facility] Friday - see recommendation above.  S/S of mastitis reviewed .  Mom has the [facility] brochure with resources.   Maternal Data    Feeding Mother's Current Feeding Choice: Breast Milk and Formula  LATCH Score                    Lactation Tools Discussed/Used Tools: Pump Flange Size: 24;27 Breast pump type: Double-Electric Breast Pump;Manual Pumped volume: 55 mL  Interventions    Discharge Discharge Education: Engorgement and breast care Pump: Manual WIC Program: Yes  Consult Status Consult Status: Complete Date: 02/19/21    [name] 02/19/2021, 12:26 PM

## 2021-03-11 ENCOUNTER — Encounter (INDEPENDENT_AMBULATORY_CARE_PROVIDER_SITE_OTHER): Payer: Self-pay | Admitting: *Deleted

## 2021-03-27 ENCOUNTER — Ambulatory Visit: Payer: Medicaid Other | Admitting: Advanced Practice Midwife

## 2021-05-17 IMAGING — DX DG CHEST 1V SAME DAY
2 series · 2 of 2 positions shown · non-contrast
Comparison: November 26, 2019

CLINICAL DATA: Status post intubation.

EXAM:
CHEST - 1 VIEW SAME DAY

[chest ap (1 of 2)]
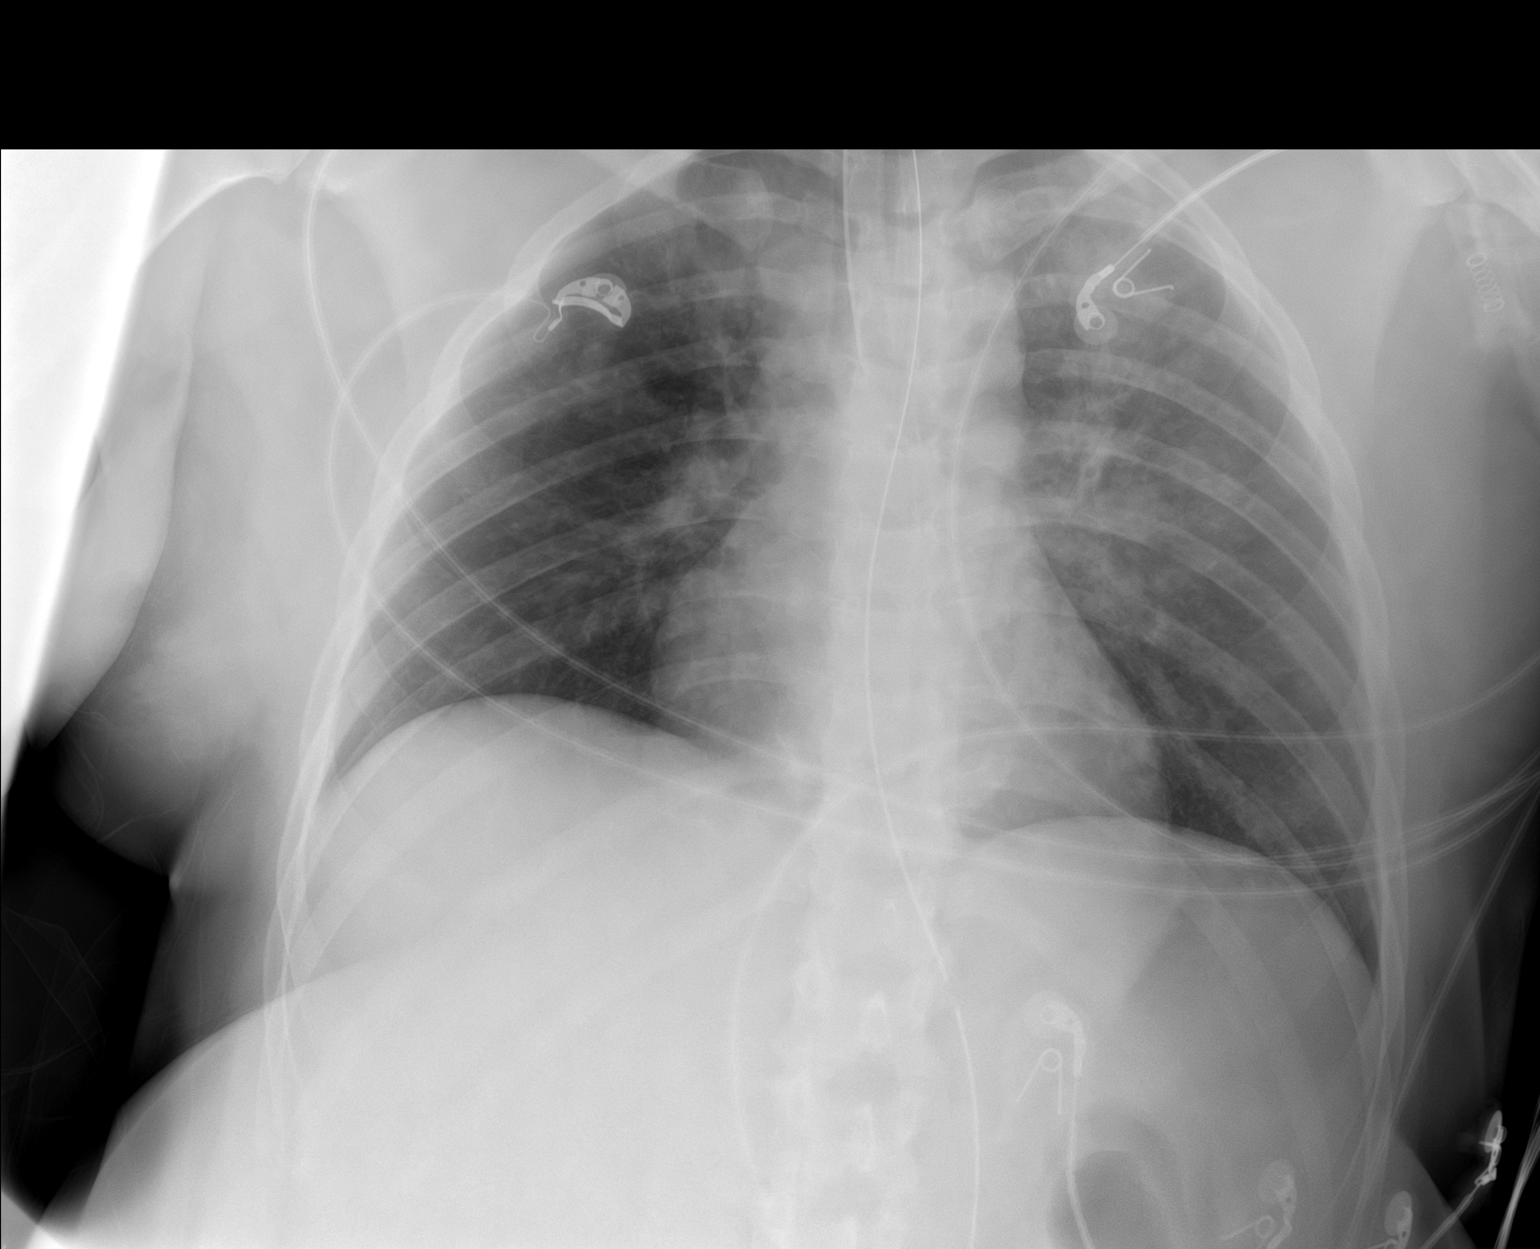

[chest ap (2 of 2)]
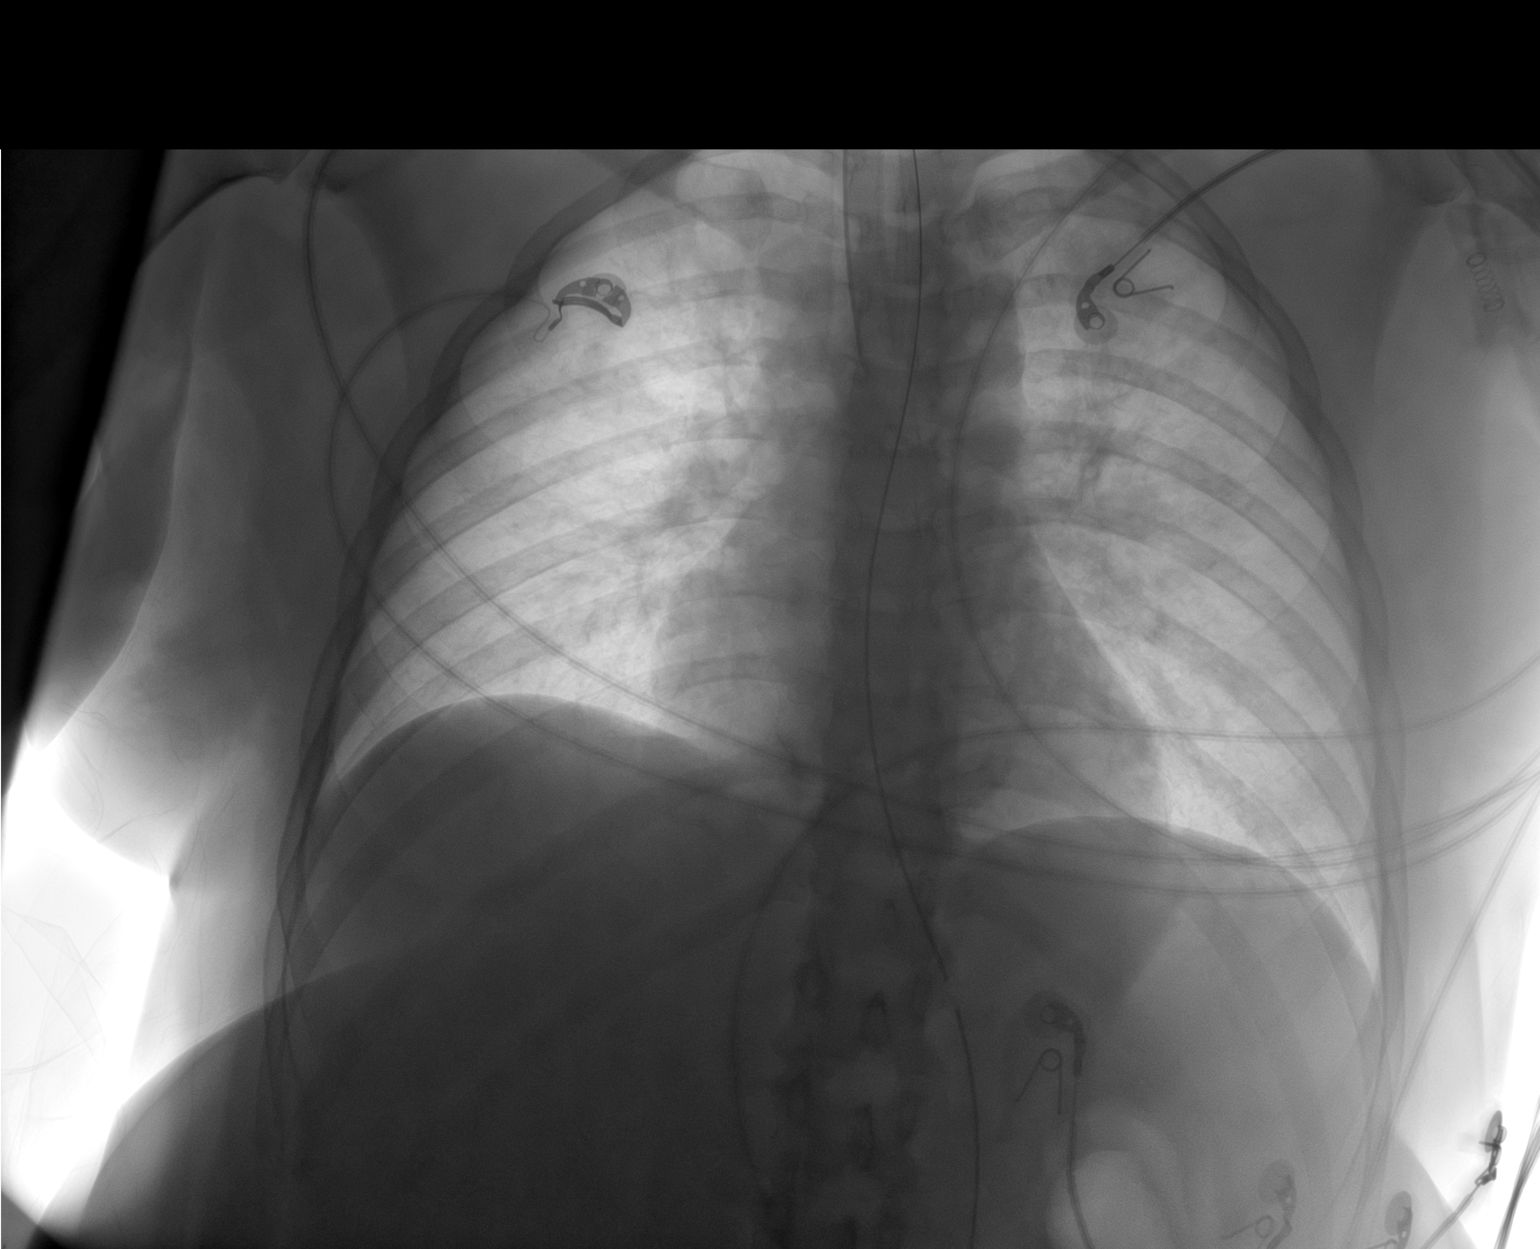

[2 of 2 positions shown; findings below may reference images not displayed]

FINDINGS: An endotracheal tube is seen with its distal tip noted at the level
of the carina.

A nasogastric tube is seen with its distal tip overlying the body of
the stomach.

Mild hazy infiltrate is seen involving the suprahilar region on the
left. This is predominant stable in severity when compared to the
prior exam.

There is no evidence of a pneumothorax or pleural effusion.

The cardiac silhouette is within normal limits.

No acute osseous abnormalities are seen.
IMPRESSION: 1. Interval endotracheal tube and nasogastric tube placement and
positioning, as described above, when compared to the prior chest
plain film dated November 26, 2018 (acquired at [DATE] p.m. Withdrawal
of the ET tube by approximately 2 cm is recommended.

## 2021-05-19 IMAGING — US US ABDOMEN LIMITED
1 series · 14 of 25 positions shown · non-contrast
Comparison: No pertinent prior studies available for comparison.

CLINICAL DATA: Provided indication: Right upper quadrant pain with
nausea and vomiting, transaminitis

EXAM:
ULTRASOUND ABDOMEN LIMITED RIGHT UPPER QUADRANT

[Series 1: us abdomen limited · 14 of 37 slices shown]
[im 1/37]
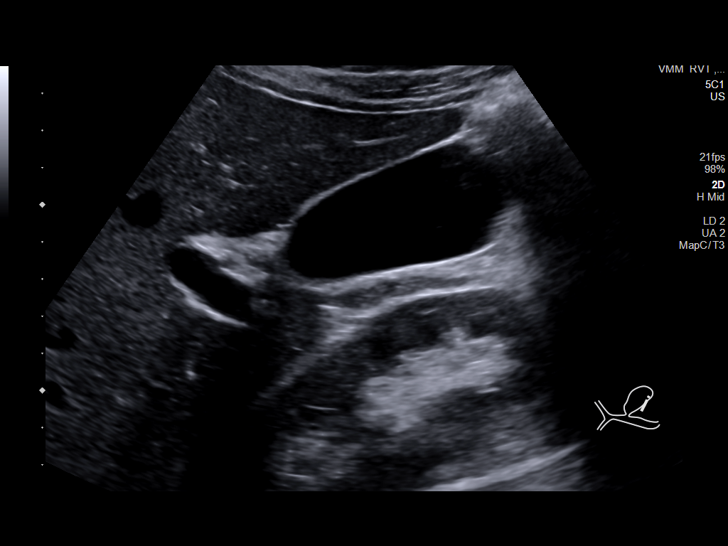
[im 4/37]
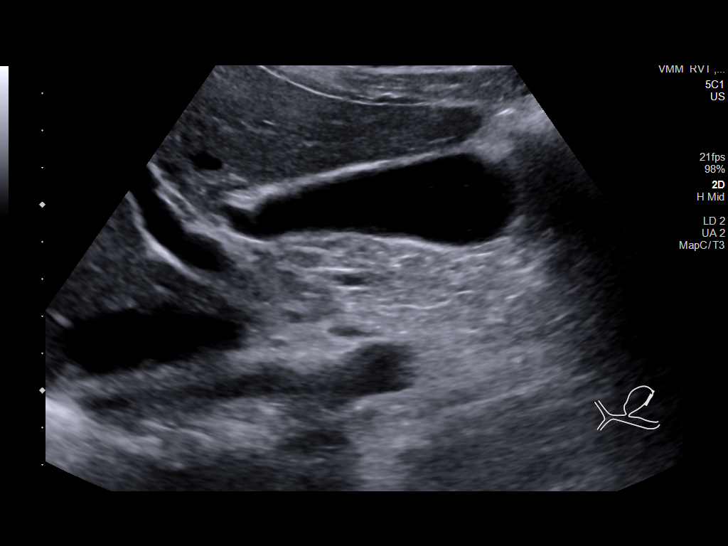
[im 7/37]
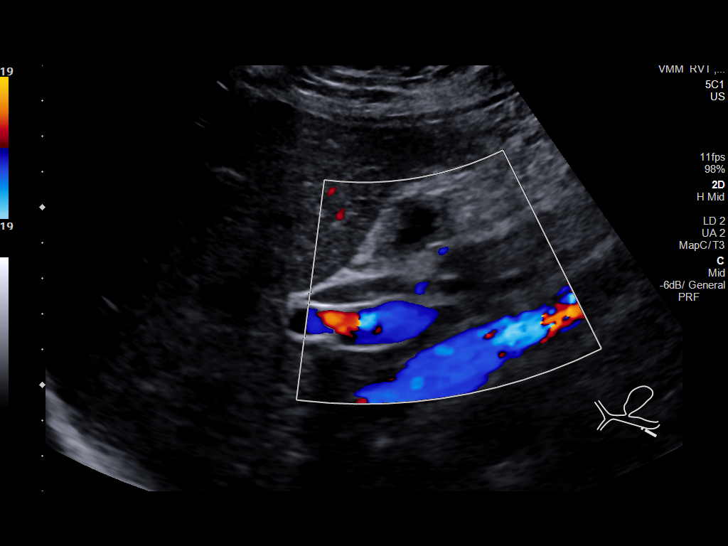
[im 10/37]
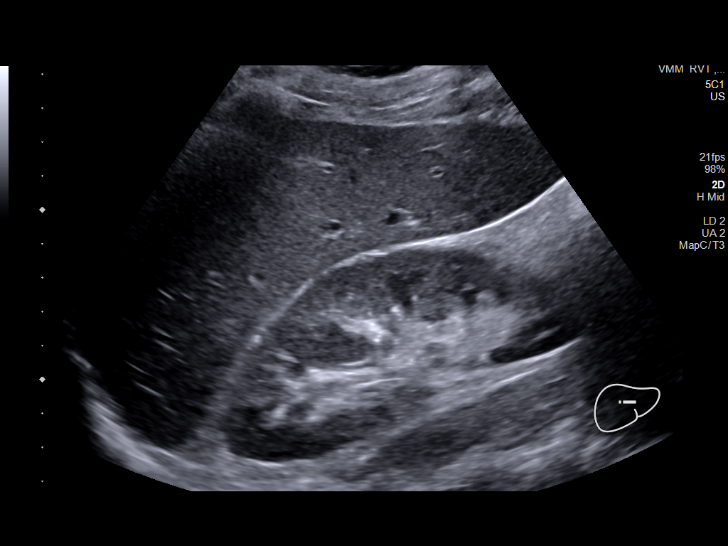
[im 13/37]
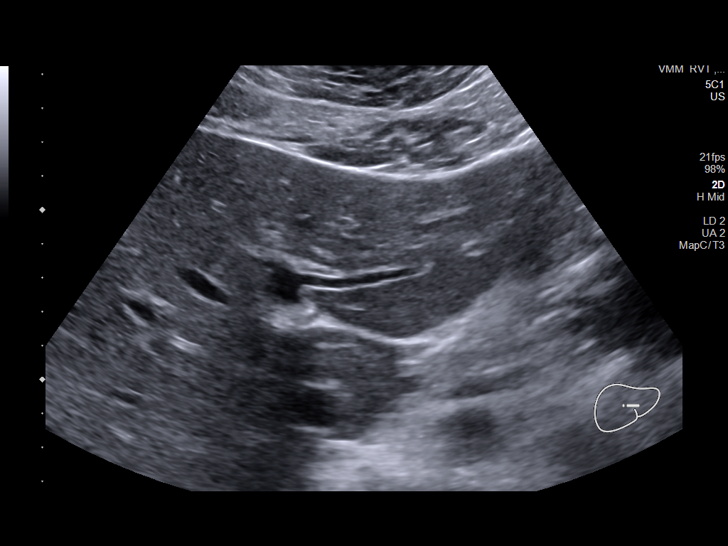
[im 14/37]
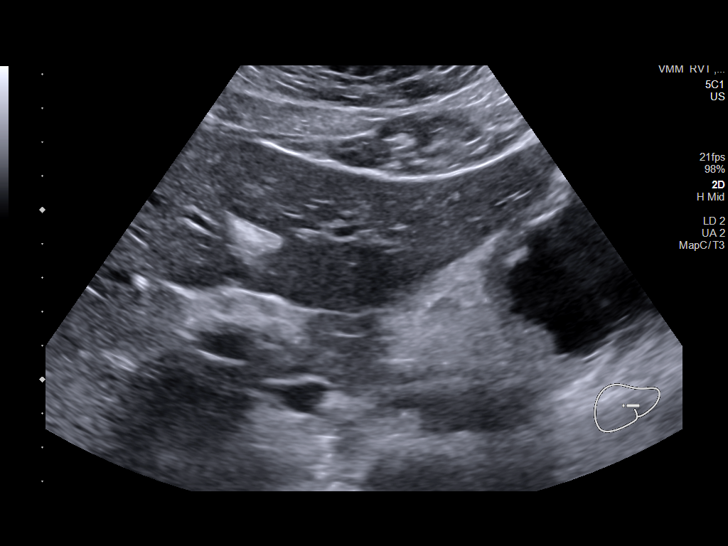
[im 17/37]
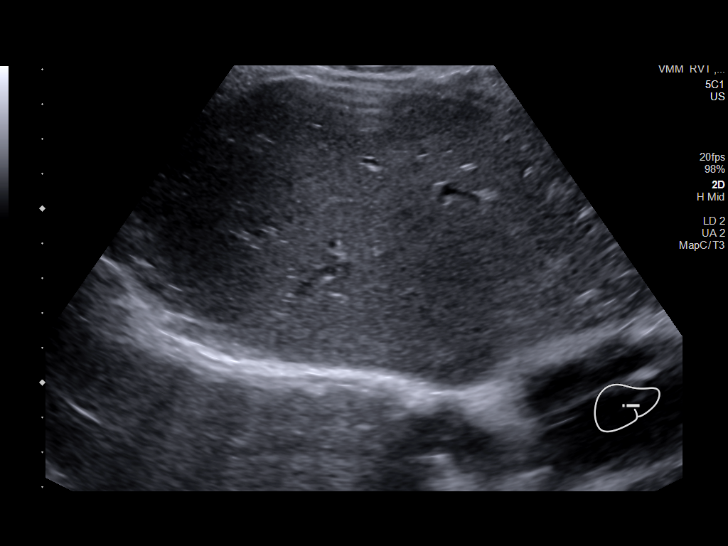
[im 20/37]
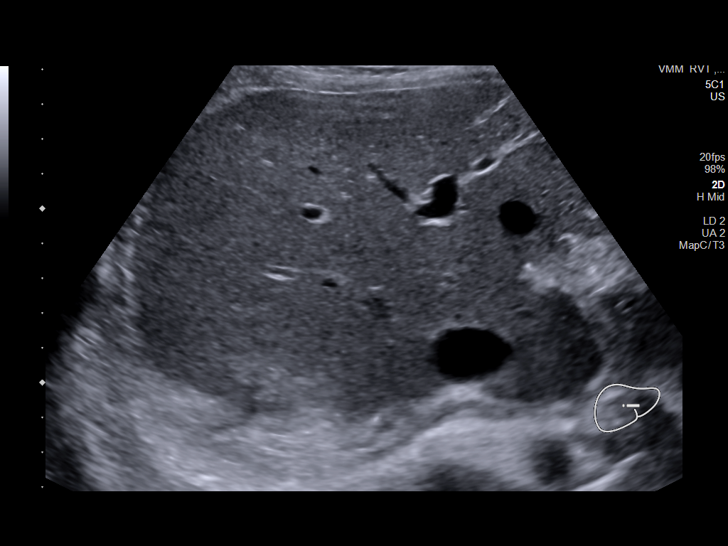
[im 23/37]
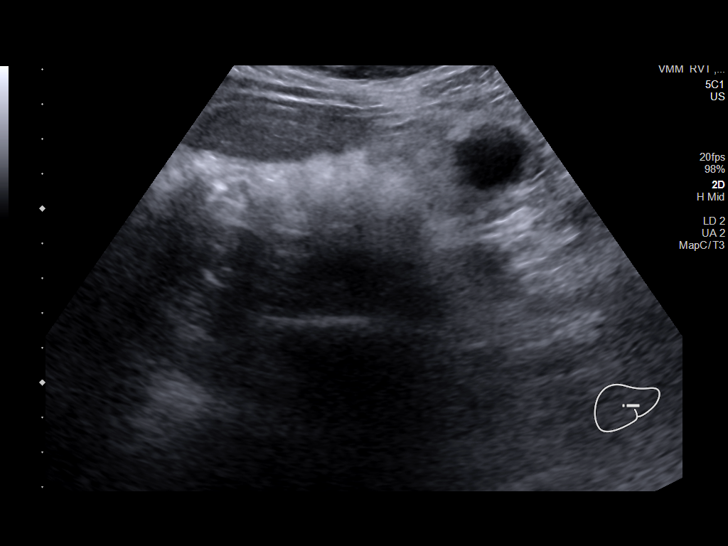
[im 25/37]
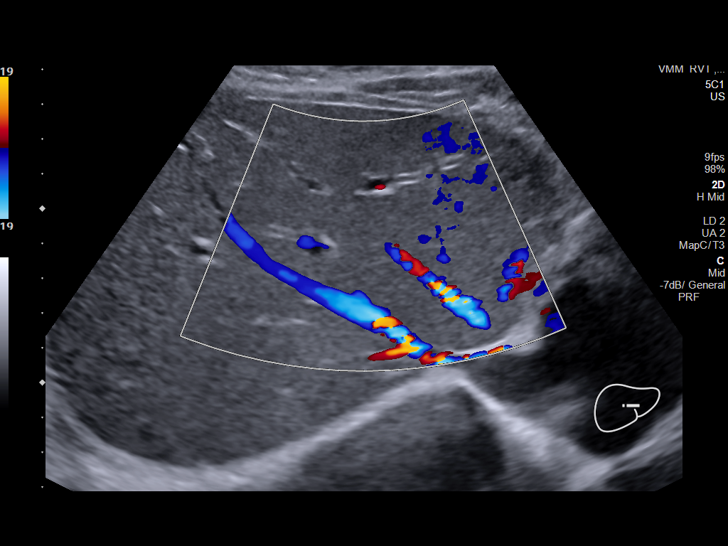
[im 28/37]
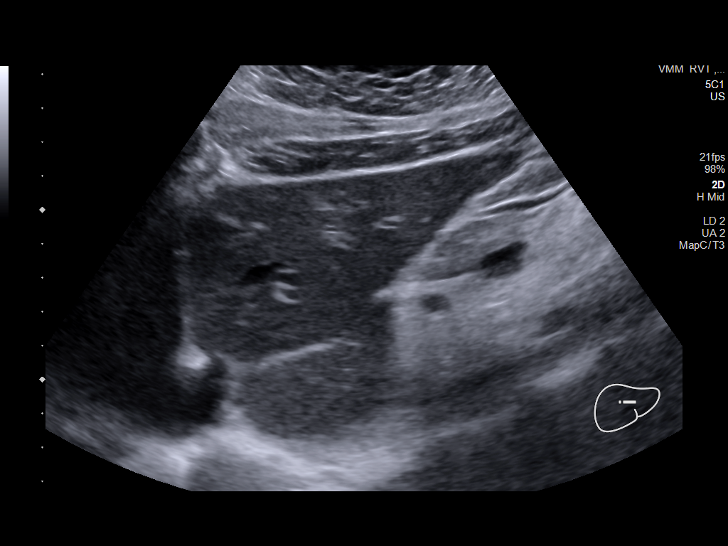
[im 31/37]
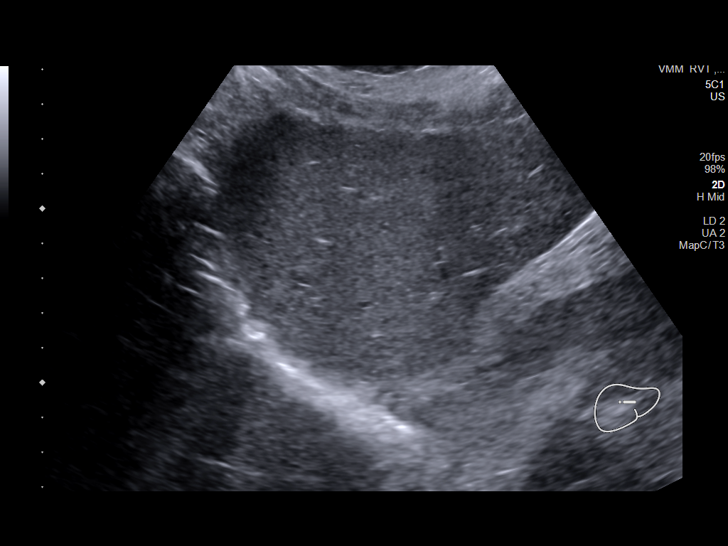
[im 34/37]
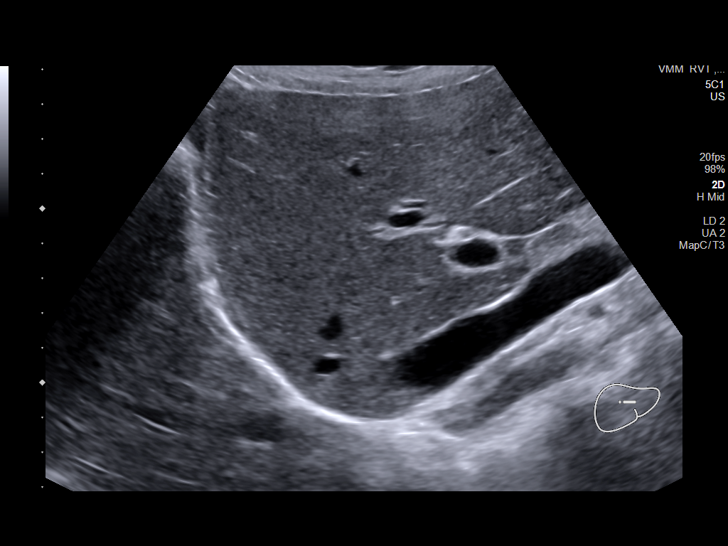
[im 37/37]
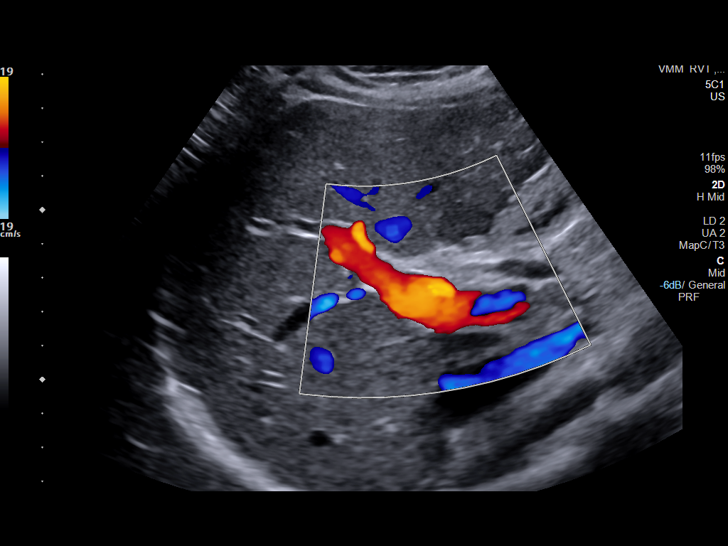

[14 of 25 positions shown; findings below may reference images not displayed]

FINDINGS: Gallbladder:

No gallstones or wall thickening visualized. No sonographic Murphy
sign noted by sonographer.

Common bile duct:

Diameter: 7 mm, mildly prominent.

Liver:

No focal lesion identified. Within normal limits in parenchymal
echogenicity. Portal vein is patent on color Doppler imaging with
normal direction of blood flow towards the liver.
IMPRESSION: Mildly prominent visualized common bile duct measuring 7 mm in
diameter.

Otherwise unremarkable right upper quadrant ultrasound as detailed.

## 2021-07-17 ENCOUNTER — Encounter (INDEPENDENT_AMBULATORY_CARE_PROVIDER_SITE_OTHER): Payer: Self-pay | Admitting: Gastroenterology

## 2021-07-17 ENCOUNTER — Ambulatory Visit (INDEPENDENT_AMBULATORY_CARE_PROVIDER_SITE_OTHER): Payer: Medicaid Other | Admitting: Gastroenterology

## 2021-10-01 ENCOUNTER — Other Ambulatory Visit: Payer: Medicaid Other | Admitting: Advanced Practice Midwife

## 2021-10-08 ENCOUNTER — Ambulatory Visit (INDEPENDENT_AMBULATORY_CARE_PROVIDER_SITE_OTHER): Payer: Medicaid Other | Admitting: Adult Health

## 2021-10-08 ENCOUNTER — Other Ambulatory Visit: Payer: Self-pay

## 2021-10-08 ENCOUNTER — Other Ambulatory Visit (HOSPITAL_COMMUNITY)
Admission: RE | Admit: 2021-10-08 | Discharge: 2021-10-08 | Disposition: A | Discharge status: Home / Self Care | Payer: Medicaid Other | Source: Ambulatory Visit | Attending: Adult Health | Admitting: Adult Health

## 2021-10-08 ENCOUNTER — Encounter: Payer: Self-pay | Admitting: Adult Health

## 2021-10-08 VITALS — BP 100/62 | HR 80 | Ht 61.5 in | Wt 145.0 lb

## 2021-10-08 DIAGNOSIS — Z113 Encounter for screening for infections with a predominantly sexual mode of transmission: Secondary | ICD-10-CM

## 2021-10-08 DIAGNOSIS — K5900 Constipation, unspecified: Secondary | ICD-10-CM | POA: Diagnosis not present

## 2021-10-08 DIAGNOSIS — Z3009 Encounter for other general counseling and advice on contraception: Secondary | ICD-10-CM | POA: Diagnosis not present

## 2021-10-08 NOTE — Progress Notes (Signed)
°  Subjective:     Patient ID: Amber Maynard, female   DOB: 1996/05/27, 26 y.o.   MRN: [id number]  [place] is a 26 year old white female, single, G1P0101, in to discuss birth control and requests STD testing. Had sex 10/03/21.  Lab Results  Component Value Date   DIAGPAP  08/26/2020    - Negative for intraepithelial lesion or malignancy (NILM)    Review of Systems +constipation +vaginal discharge and odor Periods regular  No pain with sex Reviewed past medical,surgical, social and family history. Reviewed medications and allergies.     Objective:   Physical Exam BP 100/62 (BP Location: Right Arm, Patient Position: Sitting, Cuff Size: Normal)    Pulse 80    Ht 5' 1.5" (1.562 m)    Wt 145 lb (65.8 kg)    LMP 09/10/2021 (Exact Date)    Breastfeeding No    BMI 26.95 kg/m     Skin warm and dry.Pelvic: external genitalia is normal in appearance no lesions, vagina: white discharge without odor,urethra has no lesions or masses noted, cervix:smooth and bulbous, uterus: normal size, shape and contour, non tender, no masses felt, adnexa: no masses or tenderness noted. Bladder is non tender and no masses felt. CV swab obtained. Stool felt in rectal vault, through vaginal wall.  Upstream - 10/08/21 1449       Pregnancy Intention Screening   Does the patient want to become pregnant in the next year? No    Does the patient's partner want to become pregnant in the next year? No    Would the patient like to discuss contraceptive options today? Yes      Contraception Wrap Up   Current Method No Contraceptive Precautions    End Method Abstinence   will get nexplanon   Contraception Counseling Provided Yes            Examination chaperoned by Balinda Quails NP student.    Assessment:    1. General counseling and advice for contraceptive management Discussed options she wants nexplanon   2. Screening examination for STD (sexually transmitted disease) CV swab sent for  GC/CHL,trich,BV and yeast   3. Constipation, unspecified constipation type Try senokot S 1-2 daily or fleets enema and glycerine supp    Plan:    No sex Return 10/17/21 for nexplanon insertion

## 2021-10-10 LAB — CERVICOVAGINAL ANCILLARY ONLY
Bacterial Vaginitis (gardnerella): NEGATIVE
Candida Glabrata: NEGATIVE
Candida Vaginitis: NEGATIVE
Chlamydia: NEGATIVE
Comment: NEGATIVE
Comment: NEGATIVE
Comment: NEGATIVE
Comment: NEGATIVE
Comment: NEGATIVE
Comment: NORMAL
Neisseria Gonorrhea: NEGATIVE
Trichomonas: NEGATIVE

## 2021-10-17 ENCOUNTER — Encounter: Payer: Self-pay | Admitting: Adult Health

## 2021-10-17 ENCOUNTER — Other Ambulatory Visit: Payer: Self-pay

## 2021-10-17 ENCOUNTER — Ambulatory Visit (INDEPENDENT_AMBULATORY_CARE_PROVIDER_SITE_OTHER): Payer: Medicaid Other | Admitting: Adult Health

## 2021-10-17 VITALS — BP 102/61 | HR 71 | Ht 61.5 in | Wt 143.0 lb

## 2021-10-17 DIAGNOSIS — B182 Chronic viral hepatitis C: Secondary | ICD-10-CM

## 2021-10-17 DIAGNOSIS — Z30017 Encounter for initial prescription of implantable subdermal contraceptive: Secondary | ICD-10-CM

## 2021-10-17 DIAGNOSIS — Z3202 Encounter for pregnancy test, result negative: Secondary | ICD-10-CM | POA: Insufficient documentation

## 2021-10-17 LAB — POCT URINE PREGNANCY: Preg Test, Ur: NEGATIVE

## 2021-10-17 MED ORDER — ETONOGESTREL 68 MG ~~LOC~~ IMPL
68.0000 mg | DRUG_IMPLANT | Freq: Once | SUBCUTANEOUS | Status: AC
  Administered 2021-10-17: 68 mg via SUBCUTANEOUS

## 2021-10-17 NOTE — Patient Instructions (Signed)
Use condoms x 2 weeks, keep clean and dry x 24 hours, no heavy lifting, keep steri strips on x 72 hours, Keep pressure dressing on x 24 hours. Follow up prn problems.  

## 2021-10-17 NOTE — Addendum Note (Signed)
Addended by: Colen Darling on: 10/17/2021 11:49 AM   Modules accepted: Orders

## 2021-10-17 NOTE — Progress Notes (Signed)
°  Subjective:     Patient ID: Amber Maynard, female   DOB: [birth year], 26 y.o.   MRN: [id number]  HPI Amber Maynard is a 26 year old white female,single, G1P1 in for nexplanon insertion, she had sex yesterday with condom. Lab Results  Component Value Date   DIAGPAP  08/26/2020    - Negative for intraepithelial lesion or malignancy (NILM)    Review of Systems For nexplanon insertion Reviewed past medical,surgical, social and family history. Reviewed medications and allergies.     Objective:   Physical Exam BP 102/61 (BP Location: Left Arm, Patient Position: Sitting, Cuff Size: Normal)    Pulse 71    Ht 5' 1.5" (1.562 m)    Wt 143 lb (64.9 kg)    LMP 09/05/2021    Breastfeeding No    BMI 26.58 kg/m     UPT negative. Consent signed, time out called. Left arm cleansed with betadine, and injected with 1.5 cc 2% lidocaine and waited til numb. Nexplanon easily inserted and steri strips applied.Rod easily palpated by provider and pt. Pressure dressing applied.  Fall risk is low  Upstream - 10/17/21 1116       Pregnancy Intention Screening   Does the patient want to become pregnant in the next year? No    Does the patient's partner want to become pregnant in the next year? No    Would the patient like to discuss contraceptive options today? No      Contraception Wrap Up   Current Method Female Condom    End Method Hormonal Implant             Assessment:      1. Pregnancy examination or test, negative result  2. Chronic hepatitis C without hepatic coma (HCC) Missed appt was referred by Dr Janna Arch, who has retired Will refer to Dr Levon Hedger  But told her to call them too   3. Nexplanon insertion Lot [id number] [phone]   Use condoms x 2 weeks, keep clean and dry x 24 hours, no heavy lifting, keep steri strips on x 72 hours, Keep pressure dressing on x 24 hours. Follow up prn problems.  Remove in 3 years or sooner if desired, card given   Plan:     Pap and physical 08/2023

## 2021-10-21 ENCOUNTER — Encounter (INDEPENDENT_AMBULATORY_CARE_PROVIDER_SITE_OTHER): Payer: Self-pay | Admitting: *Deleted

## 2021-11-05 ENCOUNTER — Other Ambulatory Visit: Payer: Medicaid Other | Admitting: Advanced Practice Midwife

## 2021-12-29 ENCOUNTER — Ambulatory Visit (INDEPENDENT_AMBULATORY_CARE_PROVIDER_SITE_OTHER): Payer: Medicaid Other | Admitting: Gastroenterology

## 2022-05-21 ENCOUNTER — Other Ambulatory Visit: Payer: Self-pay

## 2022-05-21 ENCOUNTER — Emergency Department (HOSPITAL_COMMUNITY)
Admission: EM | Admit: 2022-05-21 | Discharge: 2022-05-21 | Disposition: A | Discharge status: Home / Self Care | Payer: Medicaid Other | Attending: Emergency Medicine | Admitting: Emergency Medicine

## 2022-05-21 ENCOUNTER — Encounter (HOSPITAL_COMMUNITY): Payer: Self-pay | Admitting: Emergency Medicine

## 2022-05-21 DIAGNOSIS — R112 Nausea with vomiting, unspecified: Secondary | ICD-10-CM | POA: Insufficient documentation

## 2022-05-21 DIAGNOSIS — R1013 Epigastric pain: Secondary | ICD-10-CM | POA: Insufficient documentation

## 2022-05-21 DIAGNOSIS — R197 Diarrhea, unspecified: Secondary | ICD-10-CM | POA: Insufficient documentation

## 2022-05-21 LAB — LIPASE, BLOOD: Lipase: 31 U/L (ref 11–51)

## 2022-05-21 LAB — COMPREHENSIVE METABOLIC PANEL
ALT: 107 U/L — ABNORMAL HIGH (ref 0–44)
AST: 42 U/L — ABNORMAL HIGH (ref 15–41)
Albumin: 4.7 g/dL (ref 3.5–5.0)
Alkaline Phosphatase: 59 U/L (ref 38–126)
Anion gap: 6 (ref 5–15)
BUN: 18 mg/dL (ref 6–20)
CO2: 27 mmol/L (ref 22–32)
Calcium: 9.4 mg/dL (ref 8.9–10.3)
Chloride: 107 mmol/L (ref 98–111)
Creatinine, Ser: 0.84 mg/dL (ref 0.44–1.00)
GFR, Estimated: >60 mL/min (ref >60)
Glucose, Bld: 112 mg/dL — ABNORMAL HIGH (ref 70–99)
Potassium: 4 mmol/L (ref 3.5–5.1)
Sodium: 140 mmol/L (ref 135–145)
Total Bilirubin: 1 mg/dL (ref 0.3–1.2)
Total Protein: 8.2 g/dL — ABNORMAL HIGH (ref 6.5–8.1)

## 2022-05-21 LAB — URINALYSIS, ROUTINE W REFLEX MICROSCOPIC
Bacteria, UA: NONE SEEN
Bilirubin Urine: NEGATIVE
Glucose, UA: NEGATIVE mg/dL
Hgb urine dipstick: NEGATIVE
Ketones, ur: NEGATIVE mg/dL
Leukocytes,Ua: NEGATIVE
Nitrite: NEGATIVE
Protein, ur: NEGATIVE mg/dL
Specific Gravity, Urine: 1.029 (ref 1.005–1.030)
pH: 5 (ref 5.0–8.0)

## 2022-05-21 LAB — CBC
HCT: 43.2 % (ref 36.0–46.0)
Hemoglobin: 14.9 g/dL (ref 12.0–15.0)
MCH: 31 pg (ref 26.0–34.0)
MCHC: 34.5 g/dL (ref 30.0–36.0)
MCV: 90 fL (ref 80.0–100.0)
Platelets: 258 10*3/uL (ref 150–400)
RBC: 4.8 MIL/uL (ref 3.87–5.11)
RDW: 11.9 % (ref 11.5–15.5)
WBC: 7.3 10*3/uL (ref 4.0–10.5)
nRBC: 0 % (ref 0.0–0.2)

## 2022-05-21 MED ORDER — ONDANSETRON HCL 4 MG/2ML IJ SOLN
4.0000 mg | Freq: Once | INTRAMUSCULAR | Status: AC
  Administered 2022-05-21: 4 mg via INTRAVENOUS
  Filled 2022-05-21: qty 2

## 2022-05-21 MED ORDER — SODIUM CHLORIDE 0.9 % IV BOLUS
1000.0000 mL | Freq: Once | INTRAVENOUS | Status: AC
  Administered 2022-05-21: 1000 mL via INTRAVENOUS

## 2022-05-21 MED ORDER — PROMETHAZINE HCL 12.5 MG PO TABS
25.0000 mg | ORAL_TABLET | Freq: Once | ORAL | Status: AC
  Administered 2022-05-21: 25 mg via ORAL
  Filled 2022-05-21: qty 2

## 2022-05-21 MED ORDER — PROMETHAZINE HCL 25 MG PO TABS: 25.0000 mg | ORAL_TABLET | Freq: Four times a day (QID) | ORAL | 0 refills | Status: AC | PRN

## 2022-05-21 MED ORDER — PROMETHAZINE HCL 25 MG PO TABS: 25.0000 mg | ORAL_TABLET | Freq: Four times a day (QID) | ORAL | 0 refills | Status: DC | PRN

## 2022-05-21 NOTE — ED Provider Notes (Signed)
[facility] EMERGENCY DEPARTMENT Provider Note   CSN: [id number] Arrival date & time: 05/21/22  1251     History Chief Complaint  Patient presents with   Abdominal Pain    Amber Maynard is a 26 y.o. female with history of hepatitis C who presents to the emergency department today for further evaluation of abdominal pain, nausea, vomiting, diarrhea.  This started today when she got off work around 4 AM.  Patient works night shift.  She denies any fever, chills, urinary symptoms, vaginal symptoms.  She denies sick contacts.   Abdominal Pain      Home Medications Prior to Admission medications   Medication Sig Start Date End Date Taking? Authorizing Provider  etonogestrel (NEXPLANON) 68 MG IMPL implant 1 each by Subdermal route once.   Yes [provider]  amphetamine-dextroamphetamine (ADDERALL) 20 MG tablet Take 20 mg by mouth 3 (three) times daily. Patient not taking: Reported on 05/21/2022 09/03/21   [provider]  buprenorphine (SUBUTEX) 8 MG SUBL SL tablet Place 8 mg under the tongue 2 (two) times daily. Patient not taking: Reported on 05/21/2022 09/15/21   [provider]  promethazine (PHENERGAN) 25 MG tablet Take 1 tablet (25 mg total) by mouth every 6 (six) hours as needed for nausea or vomiting. 05/21/22   Teressa Lower, PA-C      Allergies    Adhesive [tape] and Bee venom    Review of Systems   Review of Systems  Gastrointestinal:  Positive for abdominal pain.  All other systems reviewed and are negative.   Physical Exam Updated Vital Signs BP 106/74   Pulse 88   Temp 98.4 F (36.9 C) (Oral)   Resp 17   Ht 5' 1.5" (1.562 m)   Wt 72.6 kg   LMP 05/14/2022 (Approximate)   SpO2 98%   BMI 29.74 kg/m  Physical Exam Vitals and nursing note reviewed.  Constitutional:      General: She is not in acute distress.    Appearance: Normal appearance.  HENT:     Head: Normocephalic and atraumatic.  Eyes:     General:        Right  eye: No discharge.        Left eye: No discharge.  Cardiovascular:     Comments: Regular rate and rhythm.  S1/S2 are distinct without any evidence of murmur, rubs, or gallops.  Radial pulses are 2+ bilaterally.  Dorsalis pedis pulses are 2+ bilaterally.  No evidence of pedal edema. Pulmonary:     Comments: Clear to auscultation bilaterally.  Normal effort.  No respiratory distress.  No evidence of wheezes, rales, or rhonchi heard throughout. Abdominal:     General: Abdomen is flat. Bowel sounds are normal. There is no distension.     Tenderness: There is abdominal tenderness in the epigastric area. There is no guarding or rebound.  Musculoskeletal:        General: Normal range of motion.     Cervical back: Neck supple.  Skin:    General: Skin is warm and dry.     Findings: No rash.  Neurological:     General: No focal deficit present.     Mental Status: She is alert.  Psychiatric:        Mood and Affect: Mood normal.        Behavior: Behavior normal.     ED Results / Procedures / Treatments   Labs (all labs ordered are listed, but only abnormal results are  displayed) Labs Reviewed  COMPREHENSIVE METABOLIC PANEL - Abnormal; Notable for the following components:      Result Value   Glucose, Bld 112 (*)    Total Protein 8.2 (*)    AST 42 (*)    ALT 107 (*)    All other components within normal limits  LIPASE, BLOOD  CBC    EKG None  Radiology No results found.  Procedures Procedures    Medications Ordered in ED Medications  sodium chloride 0.9 % bolus 1,000 mL (1,000 mLs Intravenous Bolus 05/21/22 1508)  ondansetron (ZOFRAN) injection 4 mg (4 mg Intravenous Given 05/21/22 1510)  promethazine (PHENERGAN) tablet 25 mg (25 mg Oral Given 05/21/22 1756)    ED Course/ Medical Decision Making/ A&P Clinical Course as of 05/21/22 1756  Thu May 21, 2022  1751 CBC Normal. [CF]  1751 Lipase, blood Normal. [CF]  1751 Comprehensive metabolic panel(!) There is evidence of  transaminitis but seems to be at the patient's baseline.  Slightly more elevated than normal. [CF]  1752 On reevaluation, patient states she is doing better.  I will give her some Phenergan and plan to discharge her home with Phenergan pills.  She is safe for discharge at this time. [CF]    Clinical Course User Index [CF] Teressa Lower, PA-C                           Medical Decision Making Amber Maynard is a 26 y.o. female patient who presents to the emergency department today for further evaluation of abdominal pain, nausea vomiting, diarrhea.  We will get some abdominal pain labs, give her fluids, and Zofran.  I will plan to reassess.  Patient feeling somewhat better after Zofran and fluids.  I will give her some Phenergan here and give her a prescription.  Patient feeling good to go home.  Vital signs are all normal.  Strict return precaution discussed.  She is safe for discharge.  Amount and/or Complexity of Data Reviewed Labs: ordered. Decision-making details documented in ED Course.  Risk Prescription drug management.   Final Clinical Impression(s) / ED Diagnoses Final diagnoses:  Nausea and vomiting, unspecified vomiting type    Rx / DC Orders ED Discharge Orders          Ordered    promethazine (PHENERGAN) 25 MG tablet  Every 6 hours PRN,   Status:  Discontinued        05/21/22 1755    promethazine (PHENERGAN) 25 MG tablet  Every 6 hours PRN        05/21/22 1755              Teressa Lower, PA-C 05/21/22 1756    Terrilee Files, MD 05/22/22 [phone]

## 2022-05-21 NOTE — Discharge Instructions (Signed)
I would stick to a clear liquid diet for the next few days and then start incorporating a brat diet which stands for bananas, rice, applesauce, and toast.  From there, I would then slowly incorporate your normal diet back in.  Follow-up with your primary care doctor if you have one otherwise return to the emergency department for any worsening symptoms.

## 2022-05-21 NOTE — ED Triage Notes (Signed)
Pt presents with abdominal pain with N/V/D x 1 day, history of Hep C.

## 2022-07-27 ENCOUNTER — Telehealth: Payer: Self-pay

## 2022-07-27 ENCOUNTER — Other Ambulatory Visit (HOSPITAL_COMMUNITY): Payer: Self-pay

## 2022-07-27 NOTE — Telephone Encounter (Signed)
[facility] Patient Advocate Encounter ? ?Insurance verification completed.   ? ?The patient is uninsured and will need patient assistance for medication. ? ?We can complete the application and will need to meet with the patient for signatures and income documentation. ? ?Thula Stewart, CPhT ?Specialty Pharmacy Patient Advocate ?[facility] ?Phone: [phone] ?Fax:  [phone]  ?

## 2022-07-31 ENCOUNTER — Encounter: Payer: Self-pay | Admitting: Infectious Diseases

## 2022-07-31 ENCOUNTER — Ambulatory Visit (INDEPENDENT_AMBULATORY_CARE_PROVIDER_SITE_OTHER): Payer: Self-pay | Admitting: Infectious Diseases

## 2022-07-31 ENCOUNTER — Other Ambulatory Visit: Payer: Self-pay

## 2022-07-31 VITALS — BP 118/64 | HR 71 | Resp 16 | Ht 61.0 in | Wt 189.0 lb

## 2022-07-31 DIAGNOSIS — Z5989 Other problems related to housing and economic circumstances: Secondary | ICD-10-CM

## 2022-07-31 DIAGNOSIS — Z5971 Insufficient health insurance coverage: Secondary | ICD-10-CM | POA: Insufficient documentation

## 2022-07-31 DIAGNOSIS — F1191 Opioid use, unspecified, in remission: Secondary | ICD-10-CM

## 2022-07-31 DIAGNOSIS — B182 Chronic viral hepatitis C: Secondary | ICD-10-CM

## 2022-07-31 MED ORDER — MAVYRET 100-40 MG PO TABS: 3.0000 | ORAL_TABLET | Freq: Every day | ORAL | 1 refills | Status: DC

## 2022-07-31 NOTE — Assessment & Plan Note (Signed)
We were able to help connect the patient today for [facility] assistance for lab fees and patient assistance for medication cost.

## 2022-07-31 NOTE — Progress Notes (Signed)
Patient Name: Amber Maynard  Date of Birth: 10/10/1995  MRN: [id number]  PCP: Oneita Hurt No  Referring Provider: Starr Sinclair, MD, Ph#: [phone]    CC:  New patient - initial evaluation and management of chronic hepatitis C infection.    HPI/ROS:  Amber Maynard is a 26 y.o. female to see [place] for chronic hepatitis C management and treatment.  She is joined by her grandmother today.  She tells me that she was infected during a stent of time using intravenous opiates.  She has had a few hospitalizations for accidental overdoses in the past, last [place] was 2021.  She is got a 60-month-old daughter and has been free from injection use since her pregnancy.  She has used Subutex to achieve this but currently maintained on no medication assistance.  She has a good supportive family around her.  LFTs in Sept 2023 with AST 42 / ALT 107 TBili 1.0. Has had detectable HCV RNA since 2021 through HD testing, last 02/2021 with 302,000 copies.   Has Nexplanon for contraception placed January 2023. Has a 68 month old daughter.  HIV and Hep B non-reactive with pregnancy.   She is on no routine medications prescribed and only uses omeprazole as needed.   Review of Systems  Constitutional:  Negative for appetite change, fatigue, fever and unexpected weight change.  Respiratory:  Negative for shortness of breath.   Cardiovascular:  Negative for chest pain and leg swelling.  Gastrointestinal:  Negative for abdominal pain, blood in stool, nausea and vomiting.  Genitourinary:  Negative for difficulty urinating and hematuria.  Musculoskeletal:  Negative for arthralgias.  Skin:  Negative for color change.  Neurological:  Negative for dizziness, tremors and headaches.  Hematological:  Negative for adenopathy.  Psychiatric/Behavioral:  Negative for confusion.     All other systems reviewed and are negative      Past Medical History:  Diagnosis Date   ADHD (attention deficit hyperactivity  disorder)    Hepatitis C    Nexplanon in place 03/14/2014    Prior to Admission medications   Medication Sig Start Date End Date Taking? Authorizing Provider  amphetamine-dextroamphetamine (ADDERALL) 20 MG tablet Take 20 mg by mouth 3 (three) times daily. Patient not taking: Reported on 05/21/2022 09/03/21   [provider]  buprenorphine (SUBUTEX) 8 MG SUBL SL tablet Place 8 mg under the tongue 2 (two) times daily. Patient not taking: Reported on 05/21/2022 09/15/21   [provider]  etonogestrel (NEXPLANON) 68 MG IMPL implant 1 each by Subdermal route once.    [provider]  promethazine (PHENERGAN) 25 MG tablet Take 1 tablet (25 mg total) by mouth every 6 (six) hours as needed for nausea or vomiting. 05/21/22   Teressa Lower, PA-C    Allergies  Allergen Reactions   Adhesive [Tape]     Redness and blisters at place of telemetry leads.    Bee Venom     Social History   Tobacco Use   Smoking status: Every Day    Packs/day: 0.50    Years: 10.00    Total pack years: 5.00    Types: Cigarettes   Smokeless tobacco: Never  Vaping Use   Vaping Use: Never used  Substance Use Topics   Alcohol use: Not Currently   Drug use: Not Currently    Types: Oxycodone, Heroin    No family history on file.   Objective:   Vitals:   07/31/22 1015  BP: 118/64  Pulse: 71  Resp: 16  SpO2: 98%   Constitutional: in no apparent distress, well developed and well nourished, and oriented times 3 Eyes: anicteric Cardiovascular: Cor RRR Respiratory: clear Gastrointestinal: Bowel sounds are normal, liver is not enlarged, spleen is not enlarged Musculoskeletal: peripheral pulses normal, no pedal edema, no clubbing or cyanosis Skin: negative for - jaundice, spider hemangioma, telangiectasia, palmar erythema, ecchymosis and atrophy; no porphyria cutanea tarda Lymphatic: no cervical lymphadenopathy   Laboratory: Genotype: No results found for: "HCVGENOTYPE" HCV viral  load: No results found for: "HCVQUANT" Lab Results  Component Value Date   WBC 7.3 05/21/2022   HGB 14.9 05/21/2022   HCT 43.2 05/21/2022   MCV 90.0 05/21/2022   PLT 258 05/21/2022    Lab Results  Component Value Date   CREATININE 0.84 05/21/2022   BUN 18 05/21/2022   NA 140 05/21/2022   K 4.0 05/21/2022   CL 107 05/21/2022   CO2 27 05/21/2022    Lab Results  Component Value Date   ALT 107 (H) 05/21/2022   AST 42 (H) 05/21/2022   ALKPHOS 59 05/21/2022    Lab Results  Component Value Date   BILITOT 1.0 05/21/2022   ALBUMIN 4.7 05/21/2022     Imaging:    Assessment & Plan:   Problem List Items Addressed This Visit       Unprioritized   Opioid use disorder in remission    She has been clean since December 2021.  Transition to Subutex and eventually weaned off in May 2022.  She does not feel like she needs continual MAT at this time.  I congratulated her on her hard work and encouraged her to continue her efforts.      Hepatitis C - Primary    New Patient with Chronic Hepatitis C genotype unknown, treatment naive.  We will assess the fib 4 with repeat labs today.  Her left LFTs were done in the setting of a GI virus which may have confounded the results. Mode of transmission was previous injection drug use.  She is in remission for this now.  I do not suspect she has any long-term or permanent damage from this infection counseled that should she resume intravenous drug use there is a chance she could re-infect.  I discussed with the patient the lab findings that confirm chronic hepatitis C as well as the natural history and progression of disease including about 30% of people who develop cirrhosis of the liver if left untreated and once cirrhosis is established there is a 2-7% risk per year of liver cancer and liver failure.  I discussed the importance of treatment and benefits in reducing the risk, even if significant liver fibrosis exists. I also discussed risk for  re-infection following treatment should he not continue to modify risk factors.   Patient counseled extensively on limiting acetaminophen to no more than 2 grams daily, avoidance of alcohol. Transmission discussed with patient including sexual transmission, sharing razors and toothbrush.  Will need referral to gastroenterology if concern for cirrhosis Will proceed with Mavyret for treatment Hepatitis A and B titers to be drawn today with appropriate vaccinations as needed  Pneumovax vaccine at upcoming visit if not previously given Further work up to include liver staging through non-invasive serum analysis with APRI and FIB4 scores and Liver Fibrosis panel; U/S to follow if discordant or concerning results.  Will call [employer] back once all results are in and counsel on medication over the phone.  She lives in [place].  We can do a phone visit 4 weeks into her treatment to make sure that she is feeling well and doing okay.  Further recommendations for lab and in person follow-up pending that check in.      Relevant Medications   Glecaprevir-Pibrentasvir (MAVYRET) 100-40 MG TABS   Other Relevant Orders   Hepatitis C RNA quantitative ([facility])   Hepatitis C genotype   Hepatic function panel   CBC   Liver Fibrosis, FibroTest-ActiTest   Underinsured    We were able to help connect the patient today for [facility] assistance for lab fees and patient assistance for medication cost.       I spent 45 minutes with the patient including greater than 70% of time in face to face counsel of the patient re hepatitis c and the details described above and in coordination of their care.  Rexene Alberts, MSN, NP-C [facility]  [email or web address] Pager: [phone] Office: [phone] [facility] Main Line: [phone]

## 2022-07-31 NOTE — Assessment & Plan Note (Signed)
New Patient with Chronic Hepatitis C genotype unknown, treatment naive.  We will assess the fib 4 with repeat labs today.  Her left LFTs were done in the setting of a GI virus which may have confounded the results. Mode of transmission was previous injection drug use.  She is in remission for this now.  I do not suspect she has any long-term or permanent damage from this infection counseled that should she resume intravenous drug use there is a chance she could re-infect.  I discussed with the patient the lab findings that confirm chronic hepatitis C as well as the natural history and progression of disease including about 30% of people who develop cirrhosis of the liver if left untreated and once cirrhosis is established there is a 2-7% risk per year of liver cancer and liver failure.  I discussed the importance of treatment and benefits in reducing the risk, even if significant liver fibrosis exists. I also discussed risk for re-infection following treatment should he not continue to modify risk factors.   Patient counseled extensively on limiting acetaminophen to no more than 2 grams daily, avoidance of alcohol. Transmission discussed with patient including sexual transmission, sharing razors and toothbrush.  Will need referral to gastroenterology if concern for cirrhosis Will proceed with Mavyret for treatment Hepatitis A and B titers to be drawn today with appropriate vaccinations as needed  Pneumovax vaccine at upcoming visit if not previously given Further work up to include liver staging through non-invasive serum analysis with APRI and FIB4 scores and Liver Fibrosis panel; U/S to follow if discordant or concerning results.  Will call [employer] back once all results are in and counsel on medication over the phone.  She lives in [place].  We can do a phone visit 4 weeks into her treatment to make sure that she is feeling well and doing okay.  Further recommendations for lab and  in person follow-up pending that check in.

## 2022-07-31 NOTE — Patient Instructions (Signed)
Nice to meet you today!    We need to get a little more information about your hepatitis c infection before we start your treatment. I anticipate that we can get you started in a few weeks after we submit approval to your insurance to ensure payment. We may need to place referral for an ultrasound and/or gastroenterology if your blood work indicates more damage to the liver than expected.   Go ahead and schedule a follow up on the phone with me in about 6 weeks.     ABOUT HEPATITIS C VIRUS:  Chronic Hepatitis C is the most common blood-borne infection in the [place], affecting approximately 3 million people.  It is the leading cause of cirrhosis, liver cancer, and end stage liver disease requiring transplantation when this infection goes untreated for many years  The majority of people who are infected are unaware because there are not many early symptoms that are specific to this and often go undiagnosed until a specific blood test is drawn.   The hepatitis c virus is passed primarily through direct exposure of contaminated blood or body fluids. It is most efficiently transmitted through repeated exposure to infected blood.  Risk for sexual transmission is very low but is possible if there is high frequency of unprotected sexual activity with known hepatitis c partner or multiple partners of known status.  Over time, approximately 60-70% of people can develop some degree of liver disease. Cirrhosis occurs in 10-20% of those with chronic infection. 1-5% will get liver cancer, which has a very high rate of death.   Approximately 15-25% clear the infection without medication (usually in the first 6 months of becoming exposed to virus)  Newer medications provide over 95% cure rate when taken as prescribed    IN GENERAL ABOUT DIET  Persons living with chronic hepatitis c infection should eat a diet to maintain a healthy weight and avoid nutritional deficiencies.   Completely avoiding  alcohol is the best decision for your liver health. If unable to do so please limit alcohol to as little as possible to less than 1 standard drink a day - this is very irritating to your liver.  Limit tylenol use to less than 2,000 mg daily (two extra strength tablets only twice a day)  If you have cirrhosis of the liver please take no more than 1,000 mg tylenol a day  Patients with cirrhosis should not have protein restriction; we recommend a protein intake of approximately 1.2-1.5 g/kg/day.   For patients with cirrhosis and hepatic encephalopathy, the American Association for the Study of Liver Diseases (AASLD) recommended protein intake is 1.2-1.5 g/kg/day.  If you experience ascites (fluid accumulation in the abdomen associated with severe liver damage / cirrhosis) please limit sodium intake to < 2000 mg a day    UNTIL YOU HAVE BEEN TREATED AND CURED:  Use condoms with all sexual encounters or practice abstinence to avoid sexual transmission   No sharing of razors, toothbrushes, nail clippers or anything that could potentially have blood on it.   If you cut yourself please clean and cover any wounds or open sores to others do not come into contact with your blood.   If blood spills onto item/surface please clean with 1:10 bleach solution and allow to dry, EVEN if it is dried blood.    GENERAL HELPFUL HINTS ON HCV THERAPY:  1. Stay well-hydrated.  2. Notify the ID Clinic of any changes in your other over-the-counter/herbal or prescription medications.  3.  If you miss a dose of your medication, take the missed dose as soon as you remember. Return to your regular time/dose schedule the next day.   4.  Do not stop taking your medications without first talking with your healthcare provider.  5.  You will see our pharmacist-specialist within the first 2 weeks of starting your medication to monitor for any possible side effects.  6.  You will have blood work once during treatment 4  weeks after your first pill. Again soon after treatment is completed and one final lab 3 months after your last pill to ensure cure!   TIPS TO BE SUCCESSFUL WITH DAILY MEDICATION USE:  1. Set a reminder on your phone  2. Try filling out a pill box for the week - pick a day and put one pill for every day during the week so you know right away if you missed a pill.   3. Have a trusted family member ask you about your medications.   4. Smartphone app    Medication we would like to use for you will be :   Mavyret Instructions:  Take Mavyret, three tablets (at the same time) daily with food. Please take ALL THREE PILLS AT ONCE. You should take it at approximately the same time every day. Treatment will be for 8 weeks. Do not miss a dose.    Do not run out of Mavyret! If you are down to one week of medication left and have not heard about your next shipment, please let us know as soon as possible. You will be given 28 days of treatment at a time and will receive one refill.   If you need to start a new medication, prescription from your doctor or over the counter medication, you need to contact us to make sure it does not interfere with Mavyret. There are several medications that can interfere with Mavyret and can make you sick or make the medication not work.  If you need to take a medication for acid reflux, you can take omeprazole 20mg  daily.     Tylenol (acetominophen) and Advil (ibuprofen) are safe to take with Harvoni if needed for headache, fever, pain.   IF YOU ARE ON BIRTH CONTROL PILLS, YOU RECEIVE A SHOT FOR BIRTH CONTROL, OR YOU HAVE AN IUD, please notify your provider to make sure it is safe with Mavyret.   DO NOT stop Mavyret unless instructed to by your provider. If you are hospitalized while taking this medication please bring it with you to the hospital to avoid interruption of therapy. Every pill is important!  The most common side effects associated with Mavyret include:   Fatigue Headache Nausea Diarrhea Insomnia

## 2022-07-31 NOTE — Assessment & Plan Note (Signed)
She has been clean since December 2021.  Transition to Subutex and eventually weaned off in May 2022.  She does not feel like she needs continual MAT at this time.  I congratulated her on her hard work and encouraged her to continue her efforts.

## 2022-08-03 ENCOUNTER — Telehealth: Payer: Self-pay

## 2022-08-03 NOTE — Telephone Encounter (Signed)
[facility] Patient Advocate Encounter  Completed and sent [employer] application for Mavyret for this patient who is uninsured.    Patient assistance phone number for follow up is [phone].   This encounter will be updated until final determination.   Jennilee Demarco, CPhT Specialty Pharmacy Patient Advocate [facility] Phone: [phone] Fax:  [phone]  

## 2022-08-07 LAB — CBC
HCT: 37.5 % (ref 35.0–45.0)
Hemoglobin: 12.9 g/dL (ref 11.7–15.5)
MCH: 31 pg (ref 27.0–33.0)
MCHC: 34.4 g/dL (ref 32.0–36.0)
MCV: 90.1 fL (ref 80.0–100.0)
MPV: 10.1 fL (ref 7.5–12.5)
Platelets: 267 10*3/uL (ref 140–400)
RBC: 4.16 10*6/uL (ref 3.80–5.10)
RDW: 12.2 % (ref 11.0–15.0)
WBC: 5.5 10*3/uL (ref 3.8–10.8)

## 2022-08-07 LAB — LIVER FIBROSIS, FIBROTEST-ACTITEST
ALT: 63 U/L — ABNORMAL HIGH (ref 6–29)
Alpha-2-Macroglobulin: 195 mg/dL (ref 106–279)
Apolipoprotein A1: 123 mg/dL (ref 101–198)
Bilirubin: 0.4 mg/dL (ref 0.2–1.2)
Fibrosis Score: 0.1
GGT: 13 U/L (ref 3–40)
Haptoglobin: 74 mg/dL (ref 43–212)
Necroinflammat ACT Score: 0.31
Reference ID: 4653319

## 2022-08-07 LAB — HEPATITIS C GENOTYPE

## 2022-08-07 LAB — HEPATIC FUNCTION PANEL
AG Ratio: 1.7 (calc) (ref 1.0–2.5)
ALT: 58 U/L — ABNORMAL HIGH (ref 6–29)
AST: 32 U/L — ABNORMAL HIGH (ref 10–30)
Albumin: 4.3 g/dL (ref 3.6–5.1)
Alkaline phosphatase (APISO): 48 U/L (ref 31–125)
Bilirubin, Direct: 0.1 mg/dL (ref 0.0–0.2)
Globulin: 2.5 g/dL (calc) (ref 1.9–3.7)
Indirect Bilirubin: 0.3 mg/dL (calc) (ref 0.2–1.2)
Total Bilirubin: 0.4 mg/dL (ref 0.2–1.2)
Total Protein: 6.8 g/dL (ref 6.1–8.1)

## 2022-08-07 LAB — HEPATITIS C RNA QUANTITATIVE
HCV Quantitative Log: 6.59 log IU/mL — ABNORMAL HIGH
HCV RNA, PCR, QN: 3920000 IU/mL — ABNORMAL HIGH

## 2022-08-11 ENCOUNTER — Telehealth: Payer: Self-pay

## 2022-08-11 NOTE — Telephone Encounter (Signed)
Received call from [facility] in [place] stating they think they received prescription for Mavyret in error. They would like a call back if they do in fact need to fill the Rx.   Sandie Ano, RN

## 2022-08-11 NOTE — Telephone Encounter (Signed)
Yes she is not filling with [facility] , she was just approved with [employer]. I am not sure how a script was sent to [facility].

## 2022-08-11 NOTE — Telephone Encounter (Signed)
[facility] Patient Advocate Encounter  Completed and sent MYABBVIE application for Mavyret for this patient who is uninsured.    Patient is approved 08/10/22 through 02/08/23.  Medication will be shipped to the patient home.   Clearance Coots, CPhT Specialty Pharmacy Patient [facility] Phone: [phone] Fax:  [phone]

## 2022-08-13 NOTE — Progress Notes (Signed)
GT 1a chronic hep c infection with F0.  Can proceed with mavyret x 8 weeks.  Thanks!

## 2022-08-14 NOTE — Telephone Encounter (Signed)
I called her back

## 2022-08-14 NOTE — Progress Notes (Signed)
I spoke with her Burgess Estelle

## 2022-08-14 NOTE — Progress Notes (Signed)
I did the Veterinary surgeon for [facility] and she is approved I am waiting on her to call me once her medication is delivered so I can make a 1 month follow up.

## 2022-09-09 ENCOUNTER — Telehealth: Payer: Self-pay | Admitting: Infectious Diseases

## 2022-09-17 ENCOUNTER — Other Ambulatory Visit (HOSPITAL_COMMUNITY): Payer: Self-pay

## 2022-09-23 ENCOUNTER — Other Ambulatory Visit: Payer: Self-pay

## 2022-09-23 ENCOUNTER — Ambulatory Visit (INDEPENDENT_AMBULATORY_CARE_PROVIDER_SITE_OTHER): Payer: Self-pay | Admitting: Infectious Diseases

## 2022-09-23 ENCOUNTER — Encounter: Payer: Self-pay | Admitting: Infectious Diseases

## 2022-09-23 DIAGNOSIS — B182 Chronic viral hepatitis C: Secondary | ICD-10-CM

## 2022-09-23 NOTE — Progress Notes (Signed)
Patient Name: Amber Maynard  Date of Birth: February 25, 1996  MRN: [id number]  PCP: Pcp, No  Referring Provider: No ref. provider found, Ph#: N/A   VIRTUAL CARE ENCOUNTER  I connected with [place] on 09/23/22 at  1:45 PM EST by Telephone and verified that I am speaking with the correct person using two identifiers.   I discussed the limitations, risks, security and privacy concerns of performing an evaluation and management service by telephone and the availability of in person appointments. I also discussed with the patient that there may be a patient responsible charge related to this service. The patient expressed understanding and agreed to proceed.  Patient Location: [facility]   Other Participants:   Provider Location: [facility] Office    CC:  FU Hep C treatment    HPI/ROS:  Amber Maynard is a 27 y.o. female to see [place] for chronic hepatitis C management and treatment.   GT  Started Medication around 08/13/22.  Fibrosis score: 0  She missed about 3 days worth due to shipping delay. But other than that she has taken every dose without delay. Has noticed some nausea that started just the other day. Was not present when she first started or throughout the course she has taken thusfar. She is into her second box now.  No new medications or supplements.     Review of Systems  Constitutional:  Negative for appetite change, fatigue, fever and unexpected weight change.  Respiratory:  Negative for shortness of breath.   Cardiovascular:  Negative for chest pain and leg swelling.  Gastrointestinal:  Negative for abdominal pain, blood in stool, nausea and vomiting.  Genitourinary:  Negative for difficulty urinating and hematuria.  Musculoskeletal:  Negative for arthralgias.  Skin:  Negative for color change.  Neurological:  Negative for dizziness, tremors and headaches.  Hematological:  Negative for adenopathy.  Psychiatric/Behavioral:  Negative for confusion.      All other systems reviewed and are negative      Past Medical History:  Diagnosis Date   ADHD (attention deficit hyperactivity disorder)    Hepatitis C    Nexplanon in place 03/14/2014    Prior to Admission medications   Medication Sig Start Date End Date Taking? Authorizing Provider  amphetamine-dextroamphetamine (ADDERALL) 20 MG tablet Take 20 mg by mouth 3 (three) times daily. Patient not taking: Reported on 05/21/2022 09/03/21   [provider]  buprenorphine (SUBUTEX) 8 MG SUBL SL tablet Place 8 mg under the tongue 2 (two) times daily. Patient not taking: Reported on 05/21/2022 09/15/21   [provider]  etonogestrel (NEXPLANON) 68 MG IMPL implant 1 each by Subdermal route once.    [provider]  promethazine (PHENERGAN) 25 MG tablet Take 1 tablet (25 mg total) by mouth every 6 (six) hours as needed for nausea or vomiting. 05/21/22   Hendricks Limes, PA-C    Allergies  Allergen Reactions   Adhesive [Tape]     Redness and blisters at place of telemetry leads.    Bee Venom     Social History   Tobacco Use   Smoking status: Every Day    Packs/day: 0.50    Years: 10.00    Total pack years: 5.00    Types: Cigarettes   Smokeless tobacco: Never  Vaping Use   Vaping Use: Never used  Substance Use Topics   Alcohol use: Not Currently   Drug use: Not Currently    Types: Oxycodone, Heroin  No family history on file.   Objective:   There were no vitals filed for this visit.    Laboratory: Genotype:  Lab Results  Component Value Date   HCVGENOTYPE 1a 07/31/2022   HCV viral load: No results found for: "HCVQUANT" Lab Results  Component Value Date   WBC 5.5 07/31/2022   HGB 12.9 07/31/2022   HCT 37.5 07/31/2022   MCV 90.1 07/31/2022   PLT 267 07/31/2022    Lab Results  Component Value Date   CREATININE 0.84 05/21/2022   BUN 18 05/21/2022   NA 140 05/21/2022   K 4.0 05/21/2022   CL 107 05/21/2022   CO2 27 05/21/2022    Lab  Results  Component Value Date   ALT 58 (H) 07/31/2022   ALT 63 (H) 07/31/2022   AST 32 (H) 07/31/2022   GGT 13 07/31/2022   ALKPHOS 59 05/21/2022    Lab Results  Component Value Date   BILITOT 0.4 07/31/2022   ALBUMIN 4.7 05/21/2022     Imaging:    Assessment & Plan:   Problem List Items Addressed This Visit   None    Follow Up Instructions: As above    I discussed the assessment and treatment plan with the patient. The patient was provided an opportunity to ask questions and all were answered. The patient agreed with the plan and demonstrated an understanding of the instructions.   The patient was advised to call back or seek an in-person evaluation if the symptoms worsen or if the condition fails to improve as anticipated.  I provided 11 minutes of non-face-to-face time during this encounter.   Janene Madeira, MSN, NP-C [facility] for Infectious Disease [email or web address] Pager: [phone] Office: [phone] [facility] Main Line: [place], MSN, NP-C [place] for Infectious Disease [email or web address] Pager: [phone] Office: [place]: [phone]

## 2022-09-23 NOTE — Assessment & Plan Note (Signed)
Chronic hepatitis C infection due to GT 1a, fibrosis score 0.  She is now over halfway through her 8 week course of treatment and is taking her medication correctly with food everyday. She missed 3 consecutive days due to shipping, which should not affect her cure likelihood.  Tolerating medication without any concerning side effects or concern for hepatic decompensation.  Will have her back for EOT labs in about 3 weeks. SVR visit in May 2024 has been scheduled as well.

## 2022-10-12 ENCOUNTER — Other Ambulatory Visit: Payer: Self-pay

## 2022-11-06 ENCOUNTER — Other Ambulatory Visit: Payer: Self-pay

## 2022-11-06 DIAGNOSIS — B182 Chronic viral hepatitis C: Secondary | ICD-10-CM

## 2022-11-08 LAB — HEPATITIS C RNA QUANTITATIVE
HCV Quantitative Log: <1.18 log IU/mL
HCV RNA, PCR, QN: <15 IU/mL

## 2022-11-08 LAB — COMPLETE METABOLIC PANEL WITH GFR
AG Ratio: 1.7 (calc) (ref 1.0–2.5)
ALT: 14 U/L (ref 6–29)
AST: 20 U/L (ref 10–30)
Albumin: 4.2 g/dL (ref 3.6–5.1)
Alkaline phosphatase (APISO): 57 U/L (ref 31–125)
BUN: 9 mg/dL (ref 7–25)
CO2: 26 mmol/L (ref 20–32)
Calcium: 9.2 mg/dL (ref 8.6–10.2)
Chloride: 106 mmol/L (ref 98–110)
Creat: 0.84 mg/dL (ref 0.50–0.96)
Globulin: 2.5 g/dL (calc) (ref 1.9–3.7)
Glucose, Bld: 91 mg/dL (ref 65–99)
Potassium: 3.6 mmol/L (ref 3.5–5.3)
Sodium: 140 mmol/L (ref 135–146)
Total Bilirubin: 0.6 mg/dL (ref 0.2–1.2)
Total Protein: 6.7 g/dL (ref 6.1–8.1)
eGFR: 99 mL/min/{1.73_m2} (ref >=60)

## 2022-11-10 ENCOUNTER — Telehealth: Payer: Self-pay

## 2022-11-10 NOTE — Telephone Encounter (Signed)
Patient aware of results and voiced her understanding. Patient scheduled for follow up on 02/10/2023.   [place], CMA

## 2022-11-10 NOTE — Telephone Encounter (Signed)
-----   Message from  [place], NP sent at 11/10/2022  2:27 PM EST ----- Please call Amber Maynard to let her know her liver function testing is normal now after her treatment and her hepatitis c levels are negative.  Exactly what I would like to see at this point.   Can we please schedule her a follow up one more time with me in 3 months-we will go over hep c cure and recheck her blood work to make sure she has been completely cured.   Thank you

## 2023-02-10 ENCOUNTER — Ambulatory Visit: Payer: Self-pay | Admitting: Infectious Diseases

## 2023-03-15 ENCOUNTER — Other Ambulatory Visit: Payer: Self-pay

## 2023-03-15 ENCOUNTER — Encounter: Payer: Self-pay | Admitting: Infectious Diseases

## 2023-03-15 ENCOUNTER — Ambulatory Visit (INDEPENDENT_AMBULATORY_CARE_PROVIDER_SITE_OTHER): Payer: Self-pay | Admitting: Infectious Diseases

## 2023-03-15 VITALS — BP 103/62 | HR 66 | Temp 97.2°F | Ht 60.0 in | Wt 144.0 lb

## 2023-03-15 DIAGNOSIS — B182 Chronic viral hepatitis C: Secondary | ICD-10-CM | POA: Diagnosis not present

## 2023-03-15 DIAGNOSIS — F1191 Opioid use, unspecified, in remission: Secondary | ICD-10-CM

## 2023-03-15 NOTE — Assessment & Plan Note (Addendum)
Continues to work hard with sobriety. She is pleased at how she has been doing off MAT. She understands that there is a risk of HCV reinfection if resumes injection or intranasal drug use.  She is motivated to continue to abstain!

## 2023-03-15 NOTE — Assessment & Plan Note (Signed)
Amber Maynard had a perfect response to treatment and excellent adherence with no significant missed doses.  She had pre-treatment evaluation with statistically no risk for fibrosis or severe /permanent liver damage due to this infection. Should she be effectively cured, only general liver health recommendations (avoiding obesity/metabolic disease, social alcohol only w/o binging, adequate sleep and stress control) will be necessary going forward.   She understands that her hepatitis C antibody will be positive lifelong and only recalls the old infection, not a sign of active infection.   She is released from care here should she be effectively cured - I will notify her on mychart once available.

## 2023-03-15 NOTE — Patient Instructions (Signed)
Nice to see you!   This will be your cure visit - if all remains negative you will not need any further treatment here!

## 2023-03-15 NOTE — Progress Notes (Signed)
Patient Name: Amber Maynard  Date of Birth: 1996/03/22  MRN: [id number]  PCP: Pcp, No  Referring Provider: No ref. provider found, Ph#: N/A   CC:  FU Hep C treatment - cure visit!   HPI/ROS:  Amber Maynard is a 27 y.o. female to see [place] for chronic hepatitis C management and treatment.   GT  Started Medication around 08/13/22.  Fibrosis score: 0 [place] check and EOT visits revealed undetectable HCV RNA levels.    No changes today - no concerns. She is doing very well. Daughter just had 2nd birthday.     Review of Systems  Constitutional:  Negative for appetite change, fatigue, fever and unexpected weight change.  Respiratory:  Negative for shortness of breath.   Cardiovascular:  Negative for chest pain and leg swelling.  Gastrointestinal:  Negative for abdominal pain, blood in stool, nausea and vomiting.  Genitourinary:  Negative for difficulty urinating and hematuria.  Musculoskeletal:  Negative for arthralgias.  Skin:  Negative for color change.  Neurological:  Negative for dizziness, tremors and headaches.  Hematological:  Negative for adenopathy.  Psychiatric/Behavioral:  Negative for confusion.     All other systems reviewed and are negative      Past Medical History:  Diagnosis Date   ADHD (attention deficit hyperactivity disorder)    Hepatitis C    Nexplanon in place 03/14/2014    Prior to Admission medications   Medication Sig Start Date End Date Taking? Authorizing Provider  amphetamine-dextroamphetamine (ADDERALL) 20 MG tablet Take 20 mg by mouth 3 (three) times daily. Patient not taking: Reported on 05/21/2022 09/03/21   [provider]  buprenorphine (SUBUTEX) 8 MG SUBL SL tablet Place 8 mg under the tongue 2 (two) times daily. Patient not taking: Reported on 05/21/2022 09/15/21   [provider]  etonogestrel (NEXPLANON) 68 MG IMPL implant 1 each by Subdermal route once.    [provider]  promethazine (PHENERGAN) 25 MG  tablet Take 1 tablet (25 mg total) by mouth every 6 (six) hours as needed for nausea or vomiting. 05/21/22   Teressa Lower, PA-C    Allergies  Allergen Reactions   Adhesive [Tape]     Redness and blisters at place of telemetry leads.    Bee Venom     Social History   Tobacco Use   Smoking status: Every Day    Packs/day: 0.50    Years: 10.00    Additional pack years: 0.00    Total pack years: 5.00    Types: Cigarettes   Smokeless tobacco: Never  Vaping Use   Vaping Use: Never used  Substance Use Topics   Alcohol use: Not Currently   Drug use: Not Currently    Types: Oxycodone, Heroin      Objective:   Vitals:   03/15/23 1546  BP: 103/62  Pulse: 66  Temp: (!) 97.2 F (36.2 C)      Laboratory: Genotype:  Lab Results  Component Value Date   HCVGENOTYPE 1a 07/31/2022   HCV viral load: No results found for: "HCVQUANT" Lab Results  Component Value Date   WBC 5.5 07/31/2022   HGB 12.9 07/31/2022   HCT 37.5 07/31/2022   MCV 90.1 07/31/2022   PLT 267 07/31/2022    Lab Results  Component Value Date   CREATININE 0.84 11/06/2022   BUN 9 11/06/2022   NA 140 11/06/2022   K 3.6 11/06/2022   CL 106 11/06/2022   CO2 26 11/06/2022  Lab Results  Component Value Date   ALT 14 11/06/2022   AST 20 11/06/2022   GGT 13 07/31/2022   ALKPHOS 59 05/21/2022    Lab Results  Component Value Date   BILITOT 0.6 11/06/2022   ALBUMIN 4.7 05/21/2022     Imaging:    Assessment & Plan:   Problem List Items Addressed This Visit       Unprioritized   Hepatitis C - Primary    Britne had a perfect response to treatment and excellent adherence with no significant missed doses.  She had pre-treatment evaluation with statistically no risk for fibrosis or severe /permanent liver damage due to this infection. Should she be effectively cured, only general liver health recommendations (avoiding obesity/metabolic disease, social alcohol only w/o binging, adequate sleep  and stress control) will be necessary going forward.   She understands that her hepatitis C antibody will be positive lifelong and only recalls the old infection, not a sign of active infection.   She is released from care here should she be effectively cured - I will notify her on mychart once available.       Relevant Orders   Hepatitis C RNA quantitative ([facility])   Opioid use disorder in remission    Continues to work hard with sobriety. She is pleased at how she has been doing off MAT. She understands that there is a risk of HCV reinfection if resumes injection or intranasal drug use.  She is motivated to continue to abstain!        Rexene Alberts, MSN, NP-C [facility] for Infectious Disease [facility]  [email or web address] Pager: [phone] Office: [phone] [facility] Main Line: [phone]

## 2023-03-16 LAB — HEPATITIS C RNA QUANTITATIVE
HCV Quantitative Log: <1.18 log IU/mL
HCV RNA, PCR, QN: <15 IU/mL

## 2023-03-17 NOTE — Progress Notes (Signed)
Hep C durably cured.  Mychart sent.

## 2023-10-21 ENCOUNTER — Ambulatory Visit: Payer: 59 | Admitting: Adult Health

## 2023-11-12 ENCOUNTER — Ambulatory Visit: Payer: 59 | Admitting: Advanced Practice Midwife

## 2024-06-09 ENCOUNTER — Ambulatory Visit

## 2024-06-20 ENCOUNTER — Ambulatory Visit

## 2024-06-25 ENCOUNTER — Ambulatory Visit
Admission: EM | Admit: 2024-06-25 | Discharge: 2024-06-25 | Disposition: A | Discharge status: Home / Self Care | Attending: Nurse Practitioner | Admitting: Nurse Practitioner

## 2024-06-25 DIAGNOSIS — H109 Unspecified conjunctivitis: Secondary | ICD-10-CM | POA: Diagnosis not present

## 2024-06-25 DIAGNOSIS — B9689 Other specified bacterial agents as the cause of diseases classified elsewhere: Secondary | ICD-10-CM | POA: Diagnosis not present

## 2024-06-25 MED ORDER — TOBRAMYCIN-DEXAMETHASONE 0.3-0.1 % OP SUSP: 1.0000 [drp] | Freq: Four times a day (QID) | OPHTHALMIC | 0 refills | Status: AC

## 2024-06-25 NOTE — Discharge Instructions (Addendum)
 Use eyedrops as prescribed.   You may use warm compresses to the eyes to help with pain or discomfort, cool compresses to the eyes to help with pain or swelling. You may continue using over-the-counter eyedrops such as Visine or Clear Eyes to help keep the eyes moist and lubricated. Strict handwashing when applying medication.  Avoid rubbing or manipulating the eyes while symptoms persist. Discard any eye make-up that were using prior to your symptoms starting. If symptoms fail to improve with this treatment, you may follow-up in this clinic or with your primary care physician for further evaluation. Follow-up as needed.

## 2024-06-25 NOTE — ED Triage Notes (Signed)
 Pt reports she touched her eyes after cleaning up her daughters BM off of her t.v stand x 2 days. Pt eyes are red and painful    Used clear eye MS and refress drops

## 2024-06-25 NOTE — ED Provider Notes (Signed)
 [facility]    CSN: [id number] Arrival date & time: 06/25/24  1239      History   Chief Complaint No chief complaint on file.   HPI Amber Maynard is a 29 y.o. female.   The history is provided by the patient.   Patient presents for complaints of bilateral eye redness, irritation, eye drainage, and swelling has been present for the past 2 days.  Patient denies visual changes, fever, chills, nasal congestion, runny nose, or cough.  Patient states that she suspects symptoms may have started after she cleaned up her daughter's bowel movement after she wiped that over the TV stand.  Patient states that the eyes are painful.  She states that she was taking doxycycline  for another condition, but did not notice any improvement in her eyes.  States she has been using over-the-counter eyedrops for her symptoms with minimal relief.  Patient denies use of contacts or glasses. Past Medical History:  Diagnosis Date   ADHD (attention deficit hyperactivity disorder)    Hepatitis C    Nexplanon  in place 03/14/2014    Patient Active Problem List   Diagnosis Date Noted   Underinsured 07/31/2022   Nexplanon  insertion 10/17/2021   Constipation 10/08/2021   Screening examination for STD (sexually transmitted disease) 10/08/2021   General counseling and advice for contraceptive management 10/08/2021   Rubella non-immune status, antepartum 09/02/2020   Hepatitis C 08/26/2020   Opioid use disorder in remission 07/23/2020   Smoker 11/27/2019    Past Surgical History:  Procedure Laterality Date   NO PAST SURGERIES      OB History     Gravida  1   Para  1   Term  0   Preterm  1   AB  0   Living  1      SAB  0   IAB  0   Ectopic  0   Multiple  0   Live Births  1            Home Medications    Prior to Admission medications   Medication Sig Start Date End Date Taking? Authorizing Provider  tobramycin-dexamethasone (TOBRADEX) ophthalmic solution Place  1 drop into both eyes every 6 (six) hours for 7 days. 06/25/24 07/02/24 Yes Leath-Warren, Etta PARAS, NP  buprenorphine (SUBUTEX) 8 MG SUBL SL tablet Place 8 mg under the tongue 2 (two) times daily. 09/15/21   [provider]  etonogestrel  (NEXPLANON ) 68 MG IMPL implant 1 each by Subdermal route once.    [provider]  promethazine  (PHENERGAN ) 25 MG tablet Take 1 tablet (25 mg total) by mouth every 6 (six) hours as needed for nausea or vomiting. 05/21/22   Theotis Cameron HERO, PA-C    Family History History reviewed. No pertinent family history.  Social History Social History   Tobacco Use   Smoking status: Every Day    Current packs/day: 0.50    Average packs/day: 0.5 packs/day for 10.0 years (5.0 ttl pk-yrs)    Types: Cigarettes   Smokeless tobacco: Never  Vaping Use   Vaping status: Never Used  Substance Use Topics   Alcohol use: Not Currently   Drug use: Not Currently    Types: Oxycodone , Heroin     Allergies   Adhesive [tape] and Bee venom   Review of Systems Review of Systems Per HPI  Physical Exam Triage Vital Signs ED Triage Vitals  Encounter Vitals Group     BP 06/25/24 1252 (!) 141/91  Girls Systolic BP Percentile --      Girls Diastolic BP Percentile --      Boys Systolic BP Percentile --      Boys Diastolic BP Percentile --      Pulse Rate 06/25/24 1252 (!) 102     Resp 06/25/24 1252 20     Temp 06/25/24 1252 97.9 F (36.6 C)     Temp Source 06/25/24 1252 Oral     SpO2 06/25/24 1252 98 %     Weight --      Height --      Head Circumference --      Peak Flow --      Pain Score 06/25/24 1254 7     Pain Loc --      Pain Education --      Exclude from Growth Chart --    No data found.  Updated Vital Signs BP (!) 141/91 (BP Location: Right Arm)   Pulse (!) 102   Temp 97.9 F (36.6 C) (Oral)   Resp 20   LMP 06/23/2024   SpO2 98%   Visual Acuity Right Eye Distance:   Left Eye Distance:   Bilateral Distance: 20/20  Right  Eye Near: R Near: 20/30 Left Eye Near:  L Near: 20/30 Bilateral Near:     Physical Exam Vitals and nursing note reviewed.  Constitutional:      General: She is not in acute distress.    Appearance: Normal appearance.  HENT:     Head: Normocephalic.     Right Ear: Tympanic membrane, ear canal and external ear normal.     Left Ear: Tympanic membrane, ear canal and external ear normal.     Nose: Nose normal.     Mouth/Throat:     Mouth: Mucous membranes are moist.  Eyes:     General: Vision grossly intact. No visual field deficit.       Right eye: No foreign body, discharge or hordeolum.        Left eye: No foreign body, discharge or hordeolum.     Extraocular Movements: Extraocular movements intact.     Right eye: Normal extraocular motion and no nystagmus.     Left eye: Normal extraocular motion and no nystagmus.     Conjunctiva/sclera:     Right eye: Right conjunctiva is injected. No chemosis, exudate or hemorrhage.    Left eye: Left conjunctiva is injected. No chemosis, exudate or hemorrhage.    Pupils: Pupils are equal, round, and reactive to light.  Cardiovascular:     Rate and Rhythm: Normal rate and regular rhythm.     Pulses: Normal pulses.     Heart sounds: Normal heart sounds.  Pulmonary:     Effort: Pulmonary effort is normal. No respiratory distress.     Breath sounds: Normal breath sounds. No stridor. No wheezing, rhonchi or rales.  Musculoskeletal:     Cervical back: Normal range of motion.  Skin:    General: Skin is warm and dry.  Neurological:     General: No focal deficit present.     Mental Status: She is alert and oriented to person, place, and time.  Psychiatric:        Mood and Affect: Mood normal.        Behavior: Behavior normal.      UC Treatments / Results  Labs (all labs ordered are listed, but only abnormal results are displayed) Labs Reviewed - No data to display  EKG  Radiology No results found.  Procedures Procedures (including  critical care time)  Medications Ordered in UC Medications - No data to display  Initial Impression / Assessment and Plan / UC Course  I have reviewed the triage vital signs and the nursing notes.  Pertinent labs & imaging results that were available during my care of the patient were reviewed by me and considered in my medical decision making (see chart for details).  Patient presents for complaints of eyes symptoms have been present for the past 2 days.  On exam, she has moderate injection and generalized swelling of the eyes bilaterally.  She also endorses drainage from the eyes since symptoms started.  Will cover for bacterial etiology with TobraDex ophthalmic solution.  Visual acuity is intact.  Supportive care recommendations were provided and discussed with the patient to include over-the-counter analgesics, use of over-the-counter eyedrops, and use of cool or warm compresses as needed.  Also stressed strict handwashing with the patient.  Patient was given indications regarding follow-up.  Patient was in agreement with this plan of care and verbalizes understanding.  All questions were answered.  Patient stable for discharge.  Work note was provided.   Final Clinical Impressions(s) / UC Diagnoses   Final diagnoses:  Bacterial conjunctivitis of both eyes     Discharge Instructions      Use eyedrops as prescribed.   You may use warm compresses to the eyes to help with pain or discomfort, cool compresses to the eyes to help with pain or swelling. You may continue using over-the-counter eyedrops such as Visine or Clear Eyes to help keep the eyes moist and lubricated. Strict handwashing when applying medication.  Avoid rubbing or manipulating the eyes while symptoms persist. Discard any eye make-up that were using prior to your symptoms starting. If symptoms fail to improve with this treatment, you may follow-up in this clinic or with your primary care physician for further  evaluation. Follow-up as needed.     ED Prescriptions     Medication Sig Dispense Auth. Provider   tobramycin-dexamethasone (TOBRADEX) ophthalmic solution Place 1 drop into both eyes every 6 (six) hours for 7 days. 5 mL Leath-Warren, Etta PARAS, NP      PDMP not reviewed this encounter.   [place] Etta PARAS, NP 06/25/24 1322
